# Patient Record
Sex: Female | Born: 1940 | Race: White | Hispanic: No | Smoking: Former smoker
Health system: Southern US, Community
[De-identification: ages and names within clinical notes are randomized; demographics above are authoritative.]

## PROBLEM LIST (undated history)

## (undated) DIAGNOSIS — F419 Anxiety disorder, unspecified: Secondary | ICD-10-CM

## (undated) DIAGNOSIS — N393 Stress incontinence (female) (male): Secondary | ICD-10-CM

## (undated) DIAGNOSIS — N898 Other specified noninflammatory disorders of vagina: Secondary | ICD-10-CM

## (undated) DIAGNOSIS — I10 Essential (primary) hypertension: Secondary | ICD-10-CM

## (undated) HISTORY — PX: ABDOMINAL HYSTERECTOMY: SHX81

---

## 1993-11-10 HISTORY — PX: REDUCTION MAMMAPLASTY: SUR839

## 1998-07-31 ENCOUNTER — Ambulatory Visit (HOSPITAL_COMMUNITY): Admission: RE | Admit: 1998-07-31 | Discharge: 1998-07-31 | Payer: Self-pay | Admitting: Obstetrics and Gynecology

## 2000-09-01 ENCOUNTER — Encounter: Payer: Self-pay | Admitting: Obstetrics and Gynecology

## 2000-09-01 ENCOUNTER — Encounter: Admission: RE | Admit: 2000-09-01 | Discharge: 2000-09-01 | Payer: Self-pay | Admitting: Obstetrics and Gynecology

## 2001-09-07 ENCOUNTER — Encounter: Admission: RE | Admit: 2001-09-07 | Discharge: 2001-09-07 | Payer: Self-pay | Admitting: Obstetrics and Gynecology

## 2001-09-07 ENCOUNTER — Encounter: Payer: Self-pay | Admitting: Obstetrics and Gynecology

## 2002-04-01 ENCOUNTER — Other Ambulatory Visit: Admission: RE | Admit: 2002-04-01 | Discharge: 2002-04-01 | Payer: Self-pay | Admitting: Obstetrics and Gynecology

## 2002-09-09 ENCOUNTER — Encounter: Admission: RE | Admit: 2002-09-09 | Discharge: 2002-09-09 | Payer: Self-pay | Admitting: Internal Medicine

## 2002-09-09 ENCOUNTER — Encounter: Payer: Self-pay | Admitting: Internal Medicine

## 2003-04-13 ENCOUNTER — Other Ambulatory Visit: Admission: RE | Admit: 2003-04-13 | Discharge: 2003-04-13 | Payer: Self-pay | Admitting: Obstetrics and Gynecology

## 2003-12-11 ENCOUNTER — Encounter (INDEPENDENT_AMBULATORY_CARE_PROVIDER_SITE_OTHER): Payer: Self-pay | Admitting: Specialist

## 2003-12-11 ENCOUNTER — Inpatient Hospital Stay (HOSPITAL_COMMUNITY): Admission: RE | Admit: 2003-12-11 | Discharge: 2003-12-13 | Payer: Self-pay | Admitting: Obstetrics and Gynecology

## 2003-12-11 HISTORY — PX: OTHER SURGICAL HISTORY: SHX169

## 2004-11-10 HISTORY — PX: OTHER SURGICAL HISTORY: SHX169

## 2005-05-14 ENCOUNTER — Other Ambulatory Visit: Admission: RE | Admit: 2005-05-14 | Discharge: 2005-05-14 | Payer: Self-pay | Admitting: Obstetrics and Gynecology

## 2006-04-29 ENCOUNTER — Encounter: Admission: RE | Admit: 2006-04-29 | Discharge: 2006-04-29 | Payer: Self-pay | Admitting: Internal Medicine

## 2008-09-06 ENCOUNTER — Encounter: Admission: RE | Admit: 2008-09-06 | Discharge: 2008-09-06 | Payer: Self-pay | Admitting: Family Medicine

## 2011-03-28 NOTE — Op Note (Signed)
NAME:  Kayla Logan, Kayla Logan                         ACCOUNT NO.:  [id number]   MEDICAL RECORD NO.:  [id number]                   PATIENT TYPE:  INP   LOCATION:  0440                                 FACILITY:  [facility]   PHYSICIAN:  Michelle L. Vincente Poli, M.D.            DATE OF BIRTH:  [birth year]   DATE OF PROCEDURE:  12/11/2003  DATE OF DISCHARGE:  12/13/2003                                 OPERATIVE REPORT   PREOPERATIVE DIAGNOSES:  1. Uterine prolapse.  2. Cystocele.  3. Rectocele.  4. Genuine stress urinary incontinence.   POSTOPERATIVE DIAGNOSES:  1. Uterine prolapse.  2. Cystocele.  3. Rectocele.  4. Genuine stress urinary incontinence.   PROCEDURES:  1. Total vaginal hysterectomy.  2. Anterior and posterior repair.   SURGEON:  Michelle L. Vincente Poli, M.D.   ASSISTANT:  Dineen Kid. Rana Snare, M.D.   ANESTHESIA:  General.   ESTIMATED BLOOD LOSS:  200 mL.   FINDINGS:  Grade 3 uterine prolapse, grade 3 cystocele, and grade 2  rectocele.   DESCRIPTION OF PROCEDURE:  The patient was taken to the operating room,  where she was intubated without difficulty and placed in the high lithotomy  position.  The vagina, vulva, and lower abdomen were prepped and draped in  the usual sterile fashion.  A Foley catheter was inserted to empty the  bladder.  A weighted speculum was placed in the vagina, the cervix was  grasped with a tenaculum, and a circumferential incision was made around the  cervix.  We then entered the posterior cul-de-sac in a standard fashion  using Mayo scissors and then with careful dissection anteriorly entered the  anterior cul-de-sac using sharp dissection using Metzenbaum scissors.  We  then used our curved Heaney clamps and clamped the uterosacral ligaments on  either side sequentially, each clamped and cut.  Each pedicle was cut and  suture ligated using Vicryl suture. We worked our way up along the broad  ligament with curved Heaney clamps.  Each pedicle was cut and  suture ligated  using 0 Vicryl suture.  Once we reached the level of the triple pedicle, we  then retroflexed the uterus and placed our curved Heaney clamps across the  triple pedicle on either side.  The pedicles were cut and the specimen was  set aside.  Each pedicle was then secured using a suture ligature of 0  Vicryl suture and a free tie of 0 Vicryl suture.  The ovaries appeared  normal and per the patient's request, we did not remove her ovaries because  they appeared normal.  At this point the posterior cuff was closed in a  continuous running locked stitch using 2-0 Vicryl suture and the posterior  two-thirds of the cuff was closed using 2-0 Vicryl suture in a continuous  running locked stitch. We then proceeded with our cystocele repair by  placing Allis clamps on either corner of the vaginal cuff anteriorly and  making a midline incision from the vaginal cuff to approximately 2 cm distal  to the UV angle.  We then reduced the cystocele using sharp and blunt  dissection, reduced the cystocele using three pursestring sutures using 0  Vicryl suture.  We then closed the posterior two-thirds of that using  interrupted 2-0 Vicryl suture.  At that point we left the anterior portion  of that open for Dr. Patsi Sears so that he could place the sling.  We then  proceeded with our posterior repair, placed Allis clamps at 5 and 7 o'clock,  made a V-shaped incision in the perineum, and made a midline incision up the  midline of the vaginal epithelium posteriorly.  We reduced the rectocele  using 2-0 Vicryl interrupted, moving distally to proximally.  After the  rectocele was reduced, we then trimmed the redundant tissue and closed the  vaginal epithelium using 2-0 Vicryl in a continuous running locked stitch.  At this part of the procedure Dr. Patsi Sears scrubbed in.  His portion of  the surgery will be dictated by him, and he performed a pubovaginal sling.  All sponge, lap, and instrument  counts were correct x2.  The patient  tolerated the procedure extremely well and went to the recovery room in  stable condition.                                               Michelle L. Vincente Poli, M.D.    Florestine Avers  D:  01/04/2004  T:  01/04/2004  Job:  [id number]

## 2011-03-28 NOTE — Op Note (Signed)
NAME:  Kayla Logan, Kayla Logan                         ACCOUNT NO.:  [id number]   MEDICAL RECORD NO.:  [id number]                   PATIENT TYPE:  INP   LOCATION:  NA                                   FACILITY:  [facility]   PHYSICIAN:  Sigmund I. Patsi Sears, M.D.         DATE OF BIRTH:  02-Jan-1941   DATE OF PROCEDURE:  12/11/2003  DATE OF DISCHARGE:                                 OPERATIVE REPORT   PREOPERATIVE DIAGNOSIS:  Urinary incontinence.   POSTOPERATIVE DIAGNOSIS:  Urinary incontinence.   OPERATION:  Pubovaginal sling.   SURGEON:  Sigmund I. Patsi Sears, M.D.   ANESTHESIA:  General endotracheal.   DESCRIPTION OF PROCEDURE:  With the patient in the dorsal lithotomy  position, she had previously undergone a hysterectomy, anterior and  posterior repair per Dr. Vincente Poli.   5 cc of Marcaine 0.25% plain is injected into the periurethral pubovaginal  cervical arch tissue, and a 1.5-cm incision is made over the urethra.  Subcutaneous tissue is dissected bilaterally.   Stab wounds are made 5 cm lateral to the vagina, at the level of the  clitoris.  The trans obturator tape is then passed in retrograde fashion  bilaterally.  A right-angled clamp is passed behind the sling to keep it  loose.  Cystoscopy reveals that there is no evidence of any bladder  disruption.  The bladder is drained of fluid, the tape is cut  subcutaneously, and the wound is closed with 3-0 Vicryl in two layers.  A  right-angled clamp is placed behind the sling to keep it loose while the  wings are cut.   The wound is cleaned, and packing is placed.  The patient is then given  Toradol, awakened and taken to the recovery room in good condition.                                               Sigmund I. Patsi Sears, M.D.    SIT/MEDQ  D:  12/11/2003  T:  12/11/2003  Job:  [id number]

## 2011-03-28 NOTE — Discharge Summary (Signed)
NAME:  MARRIAN, BELLS                         ACCOUNT NO.:  [id number]   MEDICAL RECORD NO.:  [id number]                   PATIENT TYPE:  INP   LOCATION:  0440                                 FACILITY:  [facility]   PHYSICIAN:  Michelle L. Vincente Poli, M.D.            DATE OF BIRTH:  December 12, 1940   DATE OF ADMISSION:  12/11/2003  DATE OF DISCHARGE:  12/13/2003                                 DISCHARGE SUMMARY   ADMISSION DIAGNOSIS:  Uterine prolapse, cystocele, rectocele, and stress  urinary incontinence.   DISCHARGE DIAGNOSIS:  Uterine prolapse, cystocele, rectocele, and stress  urinary incontinence.   HOSPITAL COURSE:  The patient is a 70 year old female who underwent a TBH,  A&P repair, and a pubovaginal sling.  She had an uneventful intraoperative  course. She did very well postoperatively and by postoperative day #2 was  ambulating, tolerating a regular diet, was voiding without any difficulty,  and had good pain control.  She had stable vital signs throughout her  hospitalization. Of note, her postoperative hemoglobin was 10.6  and  hematocrit was 31.4, white blood cell count was 9.2. This was performed on  postoperative day #1. She was discharged home with Tylox to take as needed  for pain. She is to follow up in the office in two weeks. She is advised no  driving for one week. She is advised to call if she has nausea, vomiting,  abdominal pain, or temperature greater than 100.5.                                               Michelle L. Vincente Poli, M.D.    Florestine Avers  D:  01/04/2004  T:  01/04/2004  Job:  [id number]

## 2011-11-11 HISTORY — PX: LIPOSUCTION HEAD / NECK: SUR831

## 2012-05-17 ENCOUNTER — Other Ambulatory Visit: Payer: Self-pay | Admitting: Urology

## 2012-05-18 ENCOUNTER — Encounter (HOSPITAL_BASED_OUTPATIENT_CLINIC_OR_DEPARTMENT_OTHER): Payer: Self-pay | Admitting: *Deleted

## 2012-05-18 NOTE — Progress Notes (Signed)
NPO AFTER MN. ARRIVES AT 1000. NEEDS ISTAT AND EKG. WILL TAKE PROPRANOLOL AM OF SURG W/ SIP OF WATER.

## 2012-05-21 ENCOUNTER — Encounter (HOSPITAL_BASED_OUTPATIENT_CLINIC_OR_DEPARTMENT_OTHER): Payer: Self-pay | Admitting: *Deleted

## 2012-05-21 ENCOUNTER — Encounter (HOSPITAL_BASED_OUTPATIENT_CLINIC_OR_DEPARTMENT_OTHER)
Admission: RE | Disposition: A | Discharge status: Home / Self Care | Payer: Self-pay | Source: Ambulatory Visit | Attending: Urology

## 2012-05-21 ENCOUNTER — Encounter (HOSPITAL_BASED_OUTPATIENT_CLINIC_OR_DEPARTMENT_OTHER): Payer: Self-pay | Admitting: Anesthesiology

## 2012-05-21 ENCOUNTER — Ambulatory Visit (HOSPITAL_BASED_OUTPATIENT_CLINIC_OR_DEPARTMENT_OTHER): Payer: Medicare Other | Admitting: Anesthesiology

## 2012-05-21 ENCOUNTER — Ambulatory Visit (HOSPITAL_BASED_OUTPATIENT_CLINIC_OR_DEPARTMENT_OTHER)
Admission: RE | Admit: 2012-05-21 | Discharge: 2012-05-21 | Disposition: A | Discharge status: Home / Self Care | Payer: Medicare Other | Source: Ambulatory Visit | Attending: Urology | Admitting: Urology

## 2012-05-21 DIAGNOSIS — N3010 Interstitial cystitis (chronic) without hematuria: Secondary | ICD-10-CM | POA: Insufficient documentation

## 2012-05-21 DIAGNOSIS — N3941 Urge incontinence: Secondary | ICD-10-CM | POA: Insufficient documentation

## 2012-05-21 DIAGNOSIS — I10 Essential (primary) hypertension: Secondary | ICD-10-CM | POA: Insufficient documentation

## 2012-05-21 HISTORY — DX: Essential (primary) hypertension: I10

## 2012-05-21 HISTORY — DX: Stress incontinence (female) (male): N39.3

## 2012-05-21 HISTORY — DX: Anxiety disorder, unspecified: F41.9

## 2012-05-21 HISTORY — PX: CYSTOSCOPY WITH INJECTION: SHX1424

## 2012-05-21 HISTORY — DX: Other specified noninflammatory disorders of vagina: N89.8

## 2012-05-21 LAB — POCT I-STAT 4, (NA,K, GLUC, HGB,HCT)
Hemoglobin: 13.6 g/dL (ref 12.0–15.0)
Sodium: 143 mEq/L (ref 135–145)

## 2012-05-21 SURGERY — CYSTOSCOPY, WITH INJECTION OF BLADDER NECK OR BLADDER WALL
Anesthesia: General | Site: Bladder | Wound class: Clean Contaminated

## 2012-05-21 MED ORDER — HYDROCODONE-ACETAMINOPHEN 5-325 MG PO TABS
1.0000 | ORAL_TABLET | Freq: Four times a day (QID) | ORAL | Status: DC | PRN
  Administered 2012-05-21: 1 via ORAL

## 2012-05-21 MED ORDER — ONABOTULINUMTOXINA 100 UNITS IJ SOLR
INTRAMUSCULAR | Status: DC | PRN
  Administered 2012-05-21: 200 [IU] via INTRAMUSCULAR

## 2012-05-21 MED ORDER — ONDANSETRON HCL 4 MG/2ML IJ SOLN
INTRAMUSCULAR | Status: DC | PRN
  Administered 2012-05-21: 4 mg via INTRAVENOUS

## 2012-05-21 MED ORDER — LIDOCAINE HCL (CARDIAC) 20 MG/ML IV SOLN
INTRAVENOUS | Status: DC | PRN
  Administered 2012-05-21: 80 mg via INTRAVENOUS

## 2012-05-21 MED ORDER — KETOROLAC TROMETHAMINE 30 MG/ML IJ SOLN
INTRAMUSCULAR | Status: DC | PRN
  Administered 2012-05-21: 30 mg via INTRAVENOUS

## 2012-05-21 MED ORDER — DEXAMETHASONE SODIUM PHOSPHATE 4 MG/ML IJ SOLN
INTRAMUSCULAR | Status: DC | PRN
  Administered 2012-05-21: 10 mg via INTRAVENOUS

## 2012-05-21 MED ORDER — LACTATED RINGERS IV SOLN
INTRAVENOUS | Status: DC | PRN
  Administered 2012-05-21 (×2): via INTRAVENOUS

## 2012-05-21 MED ORDER — LACTATED RINGERS IV SOLN
INTRAVENOUS | Status: DC
  Administered 2012-05-21: 11:00:00 via INTRAVENOUS

## 2012-05-21 MED ORDER — MIDAZOLAM HCL 5 MG/5ML IJ SOLN
INTRAMUSCULAR | Status: DC | PRN
  Administered 2012-05-21: 2 mg via INTRAVENOUS

## 2012-05-21 MED ORDER — URELLE 81 MG PO TABS: 1.0000 | ORAL_TABLET | Freq: Three times a day (TID) | ORAL | Status: DC

## 2012-05-21 MED ORDER — LACTATED RINGERS IV SOLN: INTRAVENOUS | Status: DC

## 2012-05-21 MED ORDER — FENTANYL CITRATE 0.05 MG/ML IJ SOLN: 25.0000 ug | INTRAMUSCULAR | Status: DC | PRN

## 2012-05-21 MED ORDER — FENTANYL CITRATE 0.05 MG/ML IJ SOLN
INTRAMUSCULAR | Status: DC | PRN
  Administered 2012-05-21: 100 ug via INTRAVENOUS

## 2012-05-21 MED ORDER — HYDROCODONE-ACETAMINOPHEN 7.5-650 MG PO TABS: 1.0000 | ORAL_TABLET | Freq: Four times a day (QID) | ORAL | Status: AC | PRN

## 2012-05-21 MED ORDER — PROPOFOL 10 MG/ML IV EMUL
INTRAVENOUS | Status: DC | PRN
  Administered 2012-05-21: 200 mg via INTRAVENOUS
  Administered 2012-05-21: 50 mg via INTRAVENOUS

## 2012-05-21 MED ORDER — STERILE WATER FOR IRRIGATION IR SOLN
Status: DC | PRN
  Administered 2012-05-21: 3000 mL

## 2012-05-21 MED ORDER — SUCCINYLCHOLINE CHLORIDE 20 MG/ML IJ SOLN
INTRAMUSCULAR | Status: DC | PRN
  Administered 2012-05-21: 20 mg via INTRAVENOUS

## 2012-05-21 SURGICAL SUPPLY — 19 items
BAG DRAIN URO-CYSTO SKYTR STRL (DRAIN) ×2 IMPLANT
BOOTIES KNEE HIGH SLOAN (MISCELLANEOUS) IMPLANT
CANISTER SUCT LVC 12 LTR MEDI- (MISCELLANEOUS) ×2 IMPLANT
CATH ROBINSON RED A/P 12FR (CATHETERS) IMPLANT
CLOTH BEACON ORANGE TIMEOUT ST (SAFETY) ×2 IMPLANT
DRAPE CAMERA CLOSED 9X96 (DRAPES) ×2 IMPLANT
ELECT REM PT RETURN 9FT ADLT (ELECTROSURGICAL)
ELECTRODE REM PT RTRN 9FT ADLT (ELECTROSURGICAL) IMPLANT
GLOVE BIO SURGEON STRL SZ7.5 (GLOVE) ×2 IMPLANT
GOWN PREVENTION PLUS LG XLONG (DISPOSABLE) ×2 IMPLANT
GOWN STRL REIN XL XLG (GOWN DISPOSABLE) ×2 IMPLANT
NDL SAFETY ECLIPSE 18X1.5 (NEEDLE) ×1 IMPLANT
NEEDLE HYPO 18GX1.5 SHARP (NEEDLE) ×1
NEEDLE RIGID UROPLASTY (NEEDLE) IMPLANT
PACK CYSTOSCOPY (CUSTOM PROCEDURE TRAY) ×2 IMPLANT
SYR 20CC LL (SYRINGE) ×2 IMPLANT
SYR BULB IRRIGATION 50ML (SYRINGE) IMPLANT
SYRINGE 10CC LL (SYRINGE) ×4 IMPLANT
WATER STERILE IRR 3000ML UROMA (IV SOLUTION) ×2 IMPLANT

## 2012-05-21 NOTE — Anesthesia Procedure Notes (Signed)
Procedure Name: LMA Insertion Date/Time: 05/21/2012 1:16 PM Performed by: Jessica Priest Pre-anesthesia Checklist: Patient identified, Emergency Drugs available, Suction available and Patient being monitored Patient Re-evaluated:Patient Re-evaluated prior to inductionOxygen Delivery Method: Circle System Utilized Preoxygenation: Pre-oxygenation with 100% oxygen Intubation Type: IV induction Ventilation: Mask ventilation without difficulty LMA: LMA inserted LMA Size: 4.0 Number of attempts: 1 Airway Equipment and Method: bite block Placement Confirmation: positive ETCO2 Tube secured with: Tape Dental Injury: Teeth and Oropharynx as per pre-operative assessment

## 2012-05-21 NOTE — Anesthesia Preprocedure Evaluation (Addendum)
Anesthesia Evaluation  Patient identified by MRN, date of birth, ID band Patient awake    Reviewed: Allergy & Precautions, H&P , NPO status , Patient's Chart, lab work & pertinent test results, reviewed documented beta blocker date and time   History of Anesthesia Complications (+) DIFFICULT AIRWAY  Airway Mallampati: II TM Distance: >3 FB Neck ROM: full    Dental  (+) Dental Advisory Given and Caps,    Pulmonary neg pulmonary ROS,  breath sounds clear to auscultation  Pulmonary exam normal       Cardiovascular Exercise Tolerance: Good hypertension, Pt. on medications and Pt. on home beta blockers negative cardio ROS  Rhythm:regular Rate:Normal     Neuro/Psych negative neurological ROS  negative psych ROS   GI/Hepatic negative GI ROS, Neg liver ROS,   Endo/Other  negative endocrine ROS  Renal/GU negative Renal ROS  negative genitourinary   Musculoskeletal   Abdominal   Peds  Hematology negative hematology ROS (+)   Anesthesia Other Findings   Reproductive/Obstetrics negative OB ROS                          Anesthesia Physical Anesthesia Plan  ASA: II  Anesthesia Plan: General   Post-op Pain Management:    Induction: Intravenous  Airway Management Planned: LMA  Additional Equipment:   Intra-op Plan:   Post-operative Plan:   Informed Consent: I have reviewed the patients History and Physical, chart, labs and discussed the procedure including the risks, benefits and alternatives for the proposed anesthesia with the patient or authorized representative who has indicated his/her understanding and acceptance.   Dental Advisory Given  Plan Discussed with: CRNA and Surgeon  Anesthesia Plan Comments:         Anesthesia Quick Evaluation

## 2012-05-21 NOTE — Transfer of Care (Signed)
Immediate Anesthesia Transfer of Care Note  Patient: Kayla Logan  Procedure(s) Performed: Procedure(s) (LRB): CYSTOSCOPY WITH INJECTION (N/A)  Patient Location: PACU  Anesthesia Type: General  Level of Consciousness: awake, sedated, patient cooperative and responds to stimulation  Airway & Oxygen Therapy: Patient Spontanous Breathing and Patient connected to face mask oxygen  Post-op Assessment: Report given to PACU RN, Post -op Vital signs reviewed and stable and Patient moving all extremities  Post vital signs: Reviewed and stable  Complications: No apparent anesthesia complications
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
## 2012-05-21 NOTE — Interval H&P Note (Signed)
History and Physical Interval Note:  05/21/2012 12:25 PM  Kayla Logan  has presented today for surgery, with the diagnosis of Overactive Bladder  The various methods of treatment have been discussed with the patient and family. After consideration of risks, benefits and other options for treatment, the patient has consented to  Procedure(s) (LRB): CYSTOSCOPY WITH INJECTION (N/A) as a surgical intervention .  The patient's history has been reviewed, patient examined, no change in status, stable for surgery.  I have reviewed the patients' chart and labs.  Questions were answered to the patient's satisfaction.     Jethro Bolus I

## 2012-05-24 NOTE — Anesthesia Postprocedure Evaluation (Signed)
Anesthesia Post Note  Patient: Kayla Logan  Procedure(s) Performed: Procedure(s) (LRB): CYSTOSCOPY WITH INJECTION (N/A)  Anesthesia type: General  Patient location: PACU  Post pain: Pain level controlled  Post assessment: Post-op Vital signs reviewed  Last Vitals:  Filed Vitals:   05/21/12 1500  BP:   Pulse: 60  Temp: 36.1 C  Resp:     Post vital signs: Reviewed  Level of consciousness: sedated  Complications: No apparent anesthesia complications

## 2012-05-25 ENCOUNTER — Encounter (HOSPITAL_BASED_OUTPATIENT_CLINIC_OR_DEPARTMENT_OTHER): Payer: Self-pay | Admitting: Urology

## 2012-05-27 ENCOUNTER — Encounter (HOSPITAL_BASED_OUTPATIENT_CLINIC_OR_DEPARTMENT_OTHER): Payer: Self-pay

## 2012-08-25 ENCOUNTER — Other Ambulatory Visit (HOSPITAL_COMMUNITY)
Admission: RE | Admit: 2012-08-25 | Discharge: 2012-08-25 | Disposition: A | Discharge status: Home / Self Care | Payer: Medicare Other | Source: Ambulatory Visit | Attending: Obstetrics and Gynecology | Admitting: Obstetrics and Gynecology

## 2012-08-25 ENCOUNTER — Other Ambulatory Visit: Payer: Self-pay | Admitting: Obstetrics and Gynecology

## 2012-08-25 DIAGNOSIS — Z1151 Encounter for screening for human papillomavirus (HPV): Secondary | ICD-10-CM | POA: Insufficient documentation

## 2012-08-25 DIAGNOSIS — Z124 Encounter for screening for malignant neoplasm of cervix: Secondary | ICD-10-CM | POA: Insufficient documentation

## 2012-09-06 ENCOUNTER — Other Ambulatory Visit: Payer: Self-pay | Admitting: Obstetrics and Gynecology

## 2012-09-06 DIAGNOSIS — Z1231 Encounter for screening mammogram for malignant neoplasm of breast: Secondary | ICD-10-CM

## 2012-10-13 ENCOUNTER — Ambulatory Visit
Admission: RE | Admit: 2012-10-13 | Discharge: 2012-10-13 | Disposition: A | Discharge status: Home / Self Care | Payer: Medicare Other | Source: Ambulatory Visit | Attending: Obstetrics and Gynecology | Admitting: Obstetrics and Gynecology

## 2012-10-13 DIAGNOSIS — Z1231 Encounter for screening mammogram for malignant neoplasm of breast: Secondary | ICD-10-CM

## 2014-06-14 ENCOUNTER — Other Ambulatory Visit: Payer: Self-pay | Admitting: Plastic Surgery

## 2014-09-05 ENCOUNTER — Other Ambulatory Visit: Payer: Self-pay

## 2014-09-05 DIAGNOSIS — Z1231 Encounter for screening mammogram for malignant neoplasm of breast: Secondary | ICD-10-CM

## 2014-09-27 ENCOUNTER — Ambulatory Visit
Admission: RE | Admit: 2014-09-27 | Discharge: 2014-09-27 | Disposition: A | Discharge status: Home / Self Care | Payer: Medicare Other | Source: Ambulatory Visit

## 2014-09-27 DIAGNOSIS — Z1231 Encounter for screening mammogram for malignant neoplasm of breast: Secondary | ICD-10-CM

## 2016-09-23 ENCOUNTER — Other Ambulatory Visit: Payer: Self-pay | Admitting: Obstetrics and Gynecology

## 2016-09-23 DIAGNOSIS — Z1231 Encounter for screening mammogram for malignant neoplasm of breast: Secondary | ICD-10-CM

## 2016-10-29 ENCOUNTER — Ambulatory Visit
Admission: RE | Admit: 2016-10-29 | Discharge: 2016-10-29 | Disposition: A | Discharge status: Home / Self Care | Payer: Medicare Other | Source: Ambulatory Visit | Attending: Obstetrics and Gynecology | Admitting: Obstetrics and Gynecology

## 2016-10-29 DIAGNOSIS — Z1231 Encounter for screening mammogram for malignant neoplasm of breast: Secondary | ICD-10-CM

## 2016-11-27 ENCOUNTER — Emergency Department (HOSPITAL_COMMUNITY)
Admission: EM | Admit: 2016-11-27 | Discharge: 2016-11-28 | Disposition: A | Discharge status: Home / Self Care | Payer: Medicare Other | Attending: Emergency Medicine | Admitting: Emergency Medicine

## 2016-11-27 ENCOUNTER — Emergency Department (HOSPITAL_COMMUNITY): Payer: Medicare Other

## 2016-11-27 ENCOUNTER — Encounter (HOSPITAL_COMMUNITY): Payer: Self-pay | Admitting: Emergency Medicine

## 2016-11-27 DIAGNOSIS — Z91040 Latex allergy status: Secondary | ICD-10-CM | POA: Diagnosis not present

## 2016-11-27 DIAGNOSIS — S42202A Unspecified fracture of upper end of left humerus, initial encounter for closed fracture: Secondary | ICD-10-CM | POA: Diagnosis not present

## 2016-11-27 DIAGNOSIS — Z79899 Other long term (current) drug therapy: Secondary | ICD-10-CM | POA: Diagnosis not present

## 2016-11-27 DIAGNOSIS — Z87891 Personal history of nicotine dependence: Secondary | ICD-10-CM | POA: Insufficient documentation

## 2016-11-27 DIAGNOSIS — Y999 Unspecified external cause status: Secondary | ICD-10-CM | POA: Diagnosis not present

## 2016-11-27 DIAGNOSIS — W000XXA Fall on same level due to ice and snow, initial encounter: Secondary | ICD-10-CM | POA: Insufficient documentation

## 2016-11-27 DIAGNOSIS — Y92096 Garden or yard of other non-institutional residence as the place of occurrence of the external cause: Secondary | ICD-10-CM | POA: Insufficient documentation

## 2016-11-27 DIAGNOSIS — S42292A Other displaced fracture of upper end of left humerus, initial encounter for closed fracture: Secondary | ICD-10-CM

## 2016-11-27 DIAGNOSIS — I10 Essential (primary) hypertension: Secondary | ICD-10-CM | POA: Insufficient documentation

## 2016-11-27 DIAGNOSIS — Y939 Activity, unspecified: Secondary | ICD-10-CM | POA: Insufficient documentation

## 2016-11-27 DIAGNOSIS — W009XXA Unspecified fall due to ice and snow, initial encounter: Secondary | ICD-10-CM

## 2016-11-27 DIAGNOSIS — S4992XA Unspecified injury of left shoulder and upper arm, initial encounter: Secondary | ICD-10-CM | POA: Diagnosis present

## 2016-11-27 MED ORDER — FENTANYL CITRATE (PF) 100 MCG/2ML IJ SOLN
50.0000 ug | Freq: Once | INTRAMUSCULAR | Status: AC
  Administered 2016-11-27: 50 ug via INTRAVENOUS
  Filled 2016-11-27: qty 2

## 2016-11-27 MED ORDER — KETOROLAC TROMETHAMINE 30 MG/ML IJ SOLN: 30.0000 mg | Freq: Once | INTRAMUSCULAR | Status: DC

## 2016-11-27 MED ORDER — OXYCODONE-ACETAMINOPHEN 5-325 MG PO TABS
2.0000 | ORAL_TABLET | Freq: Once | ORAL | Status: AC
  Administered 2016-11-27: 2 via ORAL
  Filled 2016-11-27: qty 2

## 2016-11-27 MED ORDER — OXYCODONE-ACETAMINOPHEN 5-325 MG PO TABS: 1.0000 | ORAL_TABLET | Freq: Four times a day (QID) | ORAL | 0 refills | Status: DC | PRN

## 2016-11-27 MED ORDER — KETOROLAC TROMETHAMINE 30 MG/ML IJ SOLN
15.0000 mg | Freq: Once | INTRAMUSCULAR | Status: AC
  Administered 2016-11-27: 15 mg via INTRAVENOUS
  Filled 2016-11-27: qty 1

## 2016-11-27 NOTE — ED Triage Notes (Signed)
Per EMS, pt slipped and fell in her yard on the ice and landed on her left shoulder and arm.  Pt c/o of left arm pain that radiates below the elbow and also pain in joints from the fall.   Pt is alert and oriented and speaking in full sentences.  Pt received 50 mcg of fentanyl with EMS.

## 2016-11-27 NOTE — ED Provider Notes (Signed)
[facility]-EMERGENCY DEPT Provider Note   CSN: [id number] Arrival date & time: 11/27/16  1954 By signing my name below, I, Levon Hedger, attest that this documentation has been prepared under the direction and in the presence of non-physician practitioner, Antony Madura, PA-C. Electronically Signed: Levon Hedger, Scribe. 11/27/2016. 9:00 PM.    History   Chief Complaint Chief Complaint  Patient presents with  . Fall   HPI Kayla Logan is a 76 y.o. female with a history of HTN and stress urinary incontinence who presents to the Emergency Department complaining of sudden onset, constant left shoulder pain that radiates to her left forearm onset tonight s/p mechanical fall tonight PTA. Pt states she slipped on the ice in her yard and fell, landing on her left shoulder and arm. She states she felt her upper arm "twisting" as she fell. Pt was given 50 mcg fentanyl by EMS with no reported relief of pain.  Pt describes her pain as 10/10 pain that is exacerbated by movement. She has a hx of urinary incontinence and is s/p bladder sling; reports her "pants were wet". No back pain or extremity numbness/weakness. Pt denies any LOC, head injury, bowel incontinence, or any other associated symptoms.   The history is provided by the patient. No language interpreter was used.   Past Medical History:  Diagnosis Date  . Anxiety   . Depression   . Hypertension   . SUI (stress urinary incontinence, female)   . Vaginal itching     There are no active problems to display for this patient.  Past Surgical History:  Procedure Laterality Date  . ABDOMINALPLASTY  2006  . CYSTOSCOPY WITH INJECTION  05/21/2012   Procedure: CYSTOSCOPY WITH INJECTION;  Surgeon: Kathi Ludwig, MD;  Location: [facility];  Service: Urology;  Laterality: N/A;  . LIPOSUCTION HEAD / NECK  JAN 2013  . VAGINAL HYSTERECTOMY/ ANTERIOR AND POSTERIOR REPAIR/ PUBOVAGINAL SLING  12-11-2003    OB History    No data  available     Home Medications    Prior to Admission medications   Medication Sig Start Date End Date Taking? Authorizing Provider  acetaminophen (TYLENOL) 500 MG tablet Take 1,000 mg by mouth every morning.    Yes Historical Provider, MD  Calcium Carbonate-Vitamin D (CALCIUM + D PO) Take 1 tablet by mouth daily.    Yes Historical Provider, MD  citalopram (CELEXA) 10 MG tablet Take 10 mg by mouth daily.   Yes Historical Provider, MD  docusate sodium (COLACE) 100 MG capsule Take 300 mg by mouth daily.    Yes Historical Provider, MD  estradiol (ESTRACE) 0.1 MG/GM vaginal cream Place 2 g vaginally 2 (two) times a week. Mondays and fridays   Yes Historical Provider, MD  GLUCOSAMINE-CHONDROITIN PO Take 2 tablets by mouth daily.    Yes Historical Provider, MD  losartan-hydrochlorothiazide (HYZAAR) 100-25 MG per tablet Take 1 tablet by mouth daily.   Yes Historical Provider, MD  MAGNESIUM PO Take 1 tablet by mouth daily.    Yes Historical Provider, MD  methylPREDNISolone (MEDROL) 4 MG tablet Take 2 mg by mouth daily.  11/21/16  Yes Historical Provider, MD  propranolol (INDERAL) 20 MG tablet Take 20 mg by mouth 3 (three) times daily.   Yes Historical Provider, MD  Red Yeast Rice Extract (RED YEAST RICE PO) Take 2 tablets by mouth daily.    Yes Historical Provider, MD  traMADol (ULTRAM) 50 MG tablet Take 50 mg by mouth daily.  Yes Historical Provider, MD  oxyCODONE-acetaminophen (PERCOCET/ROXICET) 5-325 MG tablet Take 1-2 tablets by mouth every 6 (six) hours as needed for severe pain. 11/27/16   Antony MaduraKelly Colisha Redler, PA-C  URELLE (URELLE/URISED) 81 MG TABS Take 1 tablet (81 mg total) by mouth 3 (three) times daily. Patient not taking: Reported on 11/27/2016 05/21/12   Jethro BolusSigmund Tannenbaum, MD   Family History History reviewed. No pertinent family history.  Social History Social History  Substance Use Topics  . Smoking status: Former Smoker    Packs/day: 0.10    Years: 15.00    Types: Cigarettes    Quit  date: 05/19/1983  . Smokeless tobacco: Never Used  . Alcohol use No    Allergies   Epinephrine; Demerol [meperidine]; and Latex  Review of Systems Review of Systems 10 systems reviewed and all are negative for acute change except as noted in the HPI.   Physical Exam Updated Vital Signs BP 142/89 (BP Location: Right Arm)   Pulse 60   Temp 99 F (37.2 C) (Oral)   Resp 20   Ht 5\' 5"  (1.651 m)   Wt 86.2 kg   SpO2 97%   BMI 31.62 kg/m   Physical Exam  Constitutional: She is oriented to person, place, and time. She appears well-developed and well-nourished. No distress.  HENT:  Head: Normocephalic and atraumatic.  Eyes: Conjunctivae and EOM are normal. No scleral icterus.  Neck: Normal range of motion.  Cardiovascular: Normal rate, regular rhythm and intact distal pulses.   Distal radial pulse 2+ in the LUE. Capillary refill brisk in all digits of the left hand.  Pulmonary/Chest: Effort normal. No respiratory distress.  Respirations even and unlabored  Musculoskeletal:  Limited ROM at the left shoulder. TTP diffusely to the LUE; worse at the left wrist and L shoulder. No crepitus or deformity. Normal ROM of wrist. No elbow effusion or crepitus. Patient reports normal ROM of the left elbow since the fall.  Neurological: She is alert and oriented to person, place, and time. She exhibits normal muscle tone. Coordination normal.  Sensation to light touch intact in the LUE. Grip strength 5/5 in the L hand.  Skin: Skin is warm and dry. No rash noted. She is not diaphoretic. No erythema. No pallor.  Psychiatric: She has a normal mood and affect. Her behavior is normal.  Nursing note and vitals reviewed.   ED Treatments / Results  DIAGNOSTIC STUDIES:  Oxygen Saturation is 98% on RA, normal by my interpretation.    COORDINATION OF CARE:  8:58 PM Discussed treatment plan with pt at bedside and pt agreed to plan.   Labs (all labs ordered are listed, but only abnormal results are  displayed) Labs Reviewed - No data to display  EKG  EKG Interpretation None      Radiology Dg Forearm Left  Result Date: 11/27/2016 CLINICAL DATA:  Pain to the left forearm after a fall EXAM: LEFT FOREARM - 2 VIEW COMPARISON:  None. FINDINGS: No acute fracture or dislocation is evident. Soft tissues are unremarkable. IMPRESSION: No acute osseous abnormality. Electronically Signed   By: Jasmine PangKim  Fujinaga M.D.   On: 11/27/2016 21:42   Dg Humerus Left  Result Date: 11/27/2016 CLINICAL DATA:  Fall with pain EXAM: LEFT HUMERUS - 2+ VIEW COMPARISON:  None. FINDINGS: No dislocation is evident. Lateral image markedly degraded by overlying soft tissues. There is a fracture of the proximal left humerus at the junction of the head and neck. Approximately 1/4 shaft diameter of displacement toward the  midline of distal fracture fragment. There is mild valgus angulation. IMPRESSION: Mildly displaced and angulated fracture of the proximal left humerus at the junction of the head and neck. No dislocation. Electronically Signed   By: Jasmine Pang M.D.   On: 11/27/2016 21:44    Procedures Procedures (including critical care time)  Medications Ordered in ED Medications  fentaNYL (SUBLIMAZE) injection 50 mcg (50 mcg Intravenous Given 11/27/16 2047)  oxyCODONE-acetaminophen (PERCOCET/ROXICET) 5-325 MG per tablet 2 tablet (2 tablets Oral Given 11/27/16 2331)  ketorolac (TORADOL) 30 MG/ML injection 15 mg (15 mg Intravenous Given 11/27/16 2333)     Initial Impression / Assessment and Plan / ED Course  I have reviewed the triage vital signs and the nursing notes.  Pertinent labs & imaging results that were available during my care of the patient were reviewed by me and considered in my medical decision making (see chart for details).     76 year old female presents to the emergency department for evaluation of injuries following a fall. She reports slipping on ice and landing on her left upper extremity. No  head trauma or loss of consciousness. No complaints of back pain, extremity numbness, or extremity weakness. Patient is neurovascularly intact. X-ray reveals a mildly displaced and angulated fracture of the proximal left humerus. No dislocation. Patient immobilized in the emergency department. She has been given small dose of Toradol as well as IV fentanyl and oral Percocet for pain control. Will refer to orthopedics to ensure proper healing. Return precautions discussed and provided. Patient discharged in stable condition with no unaddressed concerns.   Final Clinical Impressions(s) / ED Diagnoses   Final diagnoses:  Humeral head fracture, left, closed, initial encounter  Fall due to slipping on ice or snow, initial encounter    New Prescriptions Discharge Medication List as of 11/27/2016 11:12 PM    START taking these medications   Details  oxyCODONE-acetaminophen (PERCOCET/ROXICET) 5-325 MG tablet Take 1-2 tablets by mouth every 6 (six) hours as needed for severe pain., Starting Thu 11/27/2016, Print       I personally performed the services described in this documentation, which was scribed in my presence. The recorded information has been reviewed and is accurate.      Antony Madura, PA-C 11/28/16 0021    Shaune Pollack, MD 11/28/16 [phone]

## 2016-11-27 NOTE — ED Notes (Signed)
Bed: WA21 Expected date:  Expected time:  Means of arrival:  Comments: 76 yr old fall, shoulder and arm pain

## 2016-11-27 NOTE — Discharge Instructions (Signed)
Wear a sling at all times for immobilization. You can remove this for showering, if needed. Take Percocet as prescribed for pain control. Do not take Percocet with Tylenol/acetaminophen as there is already Tylenol in this medication. We also advise that you discontinue use of tramadol while taking Percocet as taking these medications together will make you increasingly drowsy/sleepy. Apply ice to areas of pain/injury to limit swelling. Follow-up with orthopedics to ensure proper wound healing. You may return to the emergency department, as needed, for new or concerning symptoms.

## 2017-09-24 ENCOUNTER — Other Ambulatory Visit: Payer: Self-pay | Admitting: Obstetrics and Gynecology

## 2017-09-24 DIAGNOSIS — Z1231 Encounter for screening mammogram for malignant neoplasm of breast: Secondary | ICD-10-CM

## 2017-11-12 ENCOUNTER — Ambulatory Visit
Admission: RE | Admit: 2017-11-12 | Discharge: 2017-11-12 | Disposition: A | Discharge status: Home / Self Care | Payer: Medicare Other | Source: Ambulatory Visit | Attending: Obstetrics and Gynecology | Admitting: Obstetrics and Gynecology

## 2017-11-12 DIAGNOSIS — Z1231 Encounter for screening mammogram for malignant neoplasm of breast: Secondary | ICD-10-CM

## 2018-08-11 ENCOUNTER — Other Ambulatory Visit: Payer: Self-pay

## 2018-08-11 ENCOUNTER — Encounter (HOSPITAL_BASED_OUTPATIENT_CLINIC_OR_DEPARTMENT_OTHER): Payer: Self-pay | Admitting: *Deleted

## 2018-08-13 ENCOUNTER — Encounter (HOSPITAL_BASED_OUTPATIENT_CLINIC_OR_DEPARTMENT_OTHER)
Admission: RE | Admit: 2018-08-13 | Discharge: 2018-08-13 | Disposition: A | Discharge status: Home / Self Care | Payer: Medicare Other | Source: Ambulatory Visit | Attending: Orthopaedic Surgery | Admitting: Orthopaedic Surgery

## 2018-08-13 DIAGNOSIS — R9431 Abnormal electrocardiogram [ECG] [EKG]: Secondary | ICD-10-CM | POA: Insufficient documentation

## 2018-08-13 DIAGNOSIS — M2041 Other hammer toe(s) (acquired), right foot: Secondary | ICD-10-CM | POA: Insufficient documentation

## 2018-08-13 DIAGNOSIS — Z01818 Encounter for other preprocedural examination: Secondary | ICD-10-CM | POA: Insufficient documentation

## 2018-08-13 LAB — BASIC METABOLIC PANEL
ANION GAP: 9 (ref 5–15)
BUN: 26 mg/dL — ABNORMAL HIGH (ref 8–23)
CHLORIDE: 106 mmol/L (ref 98–111)
CO2: 26 mmol/L (ref 22–32)
Calcium: 9.3 mg/dL (ref 8.9–10.3)
Creatinine, Ser: 1.24 mg/dL — ABNORMAL HIGH (ref 0.44–1.00)
GFR, EST AFRICAN AMERICAN: 48 mL/min — AB (ref >60)
GFR, EST NON AFRICAN AMERICAN: 41 mL/min — AB (ref >60)
Glucose, Bld: 97 mg/dL (ref 70–99)
Potassium: 3.6 mmol/L (ref 3.5–5.1)
Sodium: 141 mmol/L (ref 135–145)

## 2018-08-16 ENCOUNTER — Other Ambulatory Visit: Payer: Self-pay | Admitting: Orthopaedic Surgery

## 2018-08-18 ENCOUNTER — Ambulatory Visit (HOSPITAL_BASED_OUTPATIENT_CLINIC_OR_DEPARTMENT_OTHER): Payer: Medicare Other | Admitting: Anesthesiology

## 2018-08-18 ENCOUNTER — Other Ambulatory Visit: Payer: Self-pay

## 2018-08-18 ENCOUNTER — Encounter (HOSPITAL_BASED_OUTPATIENT_CLINIC_OR_DEPARTMENT_OTHER): Payer: Self-pay | Admitting: *Deleted

## 2018-08-18 ENCOUNTER — Encounter (HOSPITAL_BASED_OUTPATIENT_CLINIC_OR_DEPARTMENT_OTHER)
Admission: RE | Disposition: A | Discharge status: Home / Self Care | Payer: Self-pay | Source: Ambulatory Visit | Attending: Orthopaedic Surgery

## 2018-08-18 ENCOUNTER — Ambulatory Visit (HOSPITAL_BASED_OUTPATIENT_CLINIC_OR_DEPARTMENT_OTHER)
Admission: RE | Admit: 2018-08-18 | Discharge: 2018-08-18 | Disposition: A | Discharge status: Home / Self Care | Payer: Medicare Other | Source: Ambulatory Visit | Attending: Orthopaedic Surgery | Admitting: Orthopaedic Surgery

## 2018-08-18 DIAGNOSIS — F419 Anxiety disorder, unspecified: Secondary | ICD-10-CM | POA: Insufficient documentation

## 2018-08-18 DIAGNOSIS — M2041 Other hammer toe(s) (acquired), right foot: Secondary | ICD-10-CM | POA: Diagnosis present

## 2018-08-18 DIAGNOSIS — Z79899 Other long term (current) drug therapy: Secondary | ICD-10-CM | POA: Insufficient documentation

## 2018-08-18 DIAGNOSIS — Y929 Unspecified place or not applicable: Secondary | ICD-10-CM | POA: Diagnosis not present

## 2018-08-18 DIAGNOSIS — M7741 Metatarsalgia, right foot: Secondary | ICD-10-CM | POA: Insufficient documentation

## 2018-08-18 DIAGNOSIS — I10 Essential (primary) hypertension: Secondary | ICD-10-CM | POA: Insufficient documentation

## 2018-08-18 DIAGNOSIS — X58XXXA Exposure to other specified factors, initial encounter: Secondary | ICD-10-CM | POA: Insufficient documentation

## 2018-08-18 DIAGNOSIS — Z885 Allergy status to narcotic agent status: Secondary | ICD-10-CM | POA: Diagnosis not present

## 2018-08-18 DIAGNOSIS — Z87891 Personal history of nicotine dependence: Secondary | ICD-10-CM | POA: Insufficient documentation

## 2018-08-18 DIAGNOSIS — S93144A Subluxation of metatarsophalangeal joint of right lesser toe(s), initial encounter: Secondary | ICD-10-CM | POA: Insufficient documentation

## 2018-08-18 HISTORY — PX: HAMMERTOE RECONSTRUCTION WITH WEIL OSTEOTOMY: SHX5631

## 2018-08-18 SURGERY — HAMMERTOE RECONSTRUCTION WITH WEIL OSTEOTOMY
Anesthesia: Monitor Anesthesia Care | Site: Foot | Laterality: Right

## 2018-08-18 MED ORDER — BUPIVACAINE HCL 0.5 % IJ SOLN
INTRAMUSCULAR | Status: DC | PRN
  Administered 2018-08-18: 20 mL

## 2018-08-18 MED ORDER — MIDAZOLAM HCL 2 MG/2ML IJ SOLN
INTRAMUSCULAR | Status: AC
  Filled 2018-08-18: qty 2

## 2018-08-18 MED ORDER — FENTANYL CITRATE (PF) 100 MCG/2ML IJ SOLN
50.0000 ug | INTRAMUSCULAR | Status: AC | PRN
  Administered 2018-08-18 (×2): 25 ug via INTRAVENOUS
  Administered 2018-08-18: 50 ug via INTRAVENOUS

## 2018-08-18 MED ORDER — FENTANYL CITRATE (PF) 100 MCG/2ML IJ SOLN
25.0000 ug | INTRAMUSCULAR | Status: DC | PRN
  Administered 2018-08-18: 50 ug via INTRAVENOUS

## 2018-08-18 MED ORDER — PROPOFOL 500 MG/50ML IV EMUL
INTRAVENOUS | Status: DC | PRN
  Administered 2018-08-18: 75 ug/kg/min via INTRAVENOUS

## 2018-08-18 MED ORDER — FENTANYL CITRATE (PF) 100 MCG/2ML IJ SOLN
INTRAMUSCULAR | Status: AC
  Filled 2018-08-18: qty 2

## 2018-08-18 MED ORDER — SCOPOLAMINE 1 MG/3DAYS TD PT72: 1.0000 | MEDICATED_PATCH | Freq: Once | TRANSDERMAL | Status: DC | PRN

## 2018-08-18 MED ORDER — CEFAZOLIN SODIUM-DEXTROSE 2-4 GM/100ML-% IV SOLN
INTRAVENOUS | Status: AC
  Filled 2018-08-18: qty 100

## 2018-08-18 MED ORDER — OXYCODONE HCL 5 MG PO TABS
ORAL_TABLET | ORAL | Status: AC
  Filled 2018-08-18: qty 1

## 2018-08-18 MED ORDER — PROPOFOL 10 MG/ML IV BOLUS
INTRAVENOUS | Status: DC | PRN
  Administered 2018-08-18: 20 mg via INTRAVENOUS

## 2018-08-18 MED ORDER — MIDAZOLAM HCL 2 MG/2ML IJ SOLN: 1.0000 mg | INTRAMUSCULAR | Status: DC | PRN

## 2018-08-18 MED ORDER — ONDANSETRON HCL 4 MG/2ML IJ SOLN
INTRAMUSCULAR | Status: DC | PRN
  Administered 2018-08-18: 4 mg via INTRAVENOUS

## 2018-08-18 MED ORDER — OXYCODONE HCL 5 MG PO TABS
5.0000 mg | ORAL_TABLET | Freq: Once | ORAL | Status: AC | PRN
  Administered 2018-08-18: 5 mg via ORAL

## 2018-08-18 MED ORDER — ONDANSETRON HCL 4 MG/2ML IJ SOLN: 4.0000 mg | Freq: Once | INTRAMUSCULAR | Status: DC | PRN

## 2018-08-18 MED ORDER — CEFAZOLIN SODIUM-DEXTROSE 2-4 GM/100ML-% IV SOLN
2.0000 g | INTRAVENOUS | Status: AC
  Administered 2018-08-18: 2 g via INTRAVENOUS

## 2018-08-18 MED ORDER — LIDOCAINE HCL (CARDIAC) PF 100 MG/5ML IV SOSY
PREFILLED_SYRINGE | INTRAVENOUS | Status: DC | PRN
  Administered 2018-08-18: 60 mg via INTRAVENOUS

## 2018-08-18 MED ORDER — LACTATED RINGERS IV SOLN
INTRAVENOUS | Status: DC
  Administered 2018-08-18 (×2): via INTRAVENOUS

## 2018-08-18 SURGICAL SUPPLY — 71 items
BANDAGE ACE 4X5 VEL STRL LF (GAUZE/BANDAGES/DRESSINGS) ×3 IMPLANT
BANDAGE ACE 6X5 VEL STRL LF (GAUZE/BANDAGES/DRESSINGS) ×3 IMPLANT
BANDAGE ESMARK 6X9 LF (GAUZE/BANDAGES/DRESSINGS) IMPLANT
BENZOIN TINCTURE PRP APPL 2/3 (GAUZE/BANDAGES/DRESSINGS) IMPLANT
BIT DRILL PROS QC 1.5 (BIT) ×2 IMPLANT
BIT DRILL PROS QC 1.5MM (BIT) ×1
BLADE OSC/SAG .038X5.5 CUT EDG (BLADE) ×3 IMPLANT
BLADE SURG 15 STRL LF DISP TIS (BLADE) ×7 IMPLANT
BLADE SURG 15 STRL SS (BLADE) ×14
BNDG COHESIVE 4X5 TAN STRL (GAUZE/BANDAGES/DRESSINGS) IMPLANT
BNDG ESMARK 4X9 LF (GAUZE/BANDAGES/DRESSINGS) ×3 IMPLANT
BNDG ESMARK 6X9 LF (GAUZE/BANDAGES/DRESSINGS)
CAP PIN PROTECTOR ORTHO WHT (CAP) ×3 IMPLANT
CHLORAPREP W/TINT 26ML (MISCELLANEOUS) ×3 IMPLANT
CLOSURE WOUND 1/2 X4 (GAUZE/BANDAGES/DRESSINGS)
COVER BACK TABLE 60X90IN (DRAPES) ×3 IMPLANT
COVER WAND RF STERILE (DRAPES) IMPLANT
CUFF TOURNIQUET SINGLE 34IN LL (TOURNIQUET CUFF) IMPLANT
DECANTER SPIKE VIAL GLASS SM (MISCELLANEOUS) ×3 IMPLANT
DRAPE EXTREMITY T 121X128X90 (DRAPE) ×3 IMPLANT
DRAPE IMP U-DRAPE 54X76 (DRAPES) ×3 IMPLANT
DRAPE OEC MINIVIEW 54X84 (DRAPES) ×3 IMPLANT
DRAPE U-SHAPE 47X51 STRL (DRAPES) ×3 IMPLANT
ELECT REM PT RETURN 9FT ADLT (ELECTROSURGICAL) ×3
ELECTRODE REM PT RTRN 9FT ADLT (ELECTROSURGICAL) ×1 IMPLANT
GAUZE SPONGE 4X4 12PLY STRL (GAUZE/BANDAGES/DRESSINGS) ×3 IMPLANT
GAUZE XEROFORM 1X8 LF (GAUZE/BANDAGES/DRESSINGS) ×3 IMPLANT
GLOVE BIO SURGEON STRL SZ7.5 (GLOVE) IMPLANT
GLOVE BIOGEL PI IND STRL 7.0 (GLOVE) ×1 IMPLANT
GLOVE BIOGEL PI IND STRL 8 (GLOVE) ×2 IMPLANT
GLOVE BIOGEL PI INDICATOR 7.0 (GLOVE) ×2
GLOVE BIOGEL PI INDICATOR 8 (GLOVE) ×4
GLOVE SURG SS PI 7.5 STRL IVOR (GLOVE) ×6 IMPLANT
GOWN STRL REIN XL XLG (GOWN DISPOSABLE) ×3 IMPLANT
GOWN STRL REUS W/ TWL LRG LVL3 (GOWN DISPOSABLE) IMPLANT
GOWN STRL REUS W/ TWL XL LVL3 (GOWN DISPOSABLE) ×1 IMPLANT
GOWN STRL REUS W/TWL LRG LVL3 (GOWN DISPOSABLE)
GOWN STRL REUS W/TWL XL LVL3 (GOWN DISPOSABLE) ×2
K-WIRE .035X4 (WIRE) ×3 IMPLANT
K-WIRE .045X4 (WIRE) ×3 IMPLANT
NEEDLE HYPO 25X1 1.5 SAFETY (NEEDLE) ×3 IMPLANT
NS IRRIG 1000ML POUR BTL (IV SOLUTION) ×3 IMPLANT
PACK BASIN DAY SURGERY FS (CUSTOM PROCEDURE TRAY) ×3 IMPLANT
PAD CAST 4YDX4 CTTN HI CHSV (CAST SUPPLIES) ×1 IMPLANT
PADDING CAST COTTON 4X4 STRL (CAST SUPPLIES) ×2
PADDING CAST SYNTHETIC 4 (CAST SUPPLIES) ×2
PADDING CAST SYNTHETIC 4X4 STR (CAST SUPPLIES) ×1 IMPLANT
PENCIL BUTTON HOLSTER BLD 10FT (ELECTRODE) ×3 IMPLANT
SCREW CORTEX ST 2.0X18 (Screw) ×3 IMPLANT
SLEEVE SCD COMPRESS KNEE MED (MISCELLANEOUS) ×3 IMPLANT
SPLINT FIBERGLASS 4X30 (CAST SUPPLIES) ×3 IMPLANT
SPONGE LAP 18X18 RF (DISPOSABLE) IMPLANT
STOCKINETTE 6  STRL (DRAPES) ×2
STOCKINETTE 6 STRL (DRAPES) ×1 IMPLANT
STRIP CLOSURE SKIN 1/2X4 (GAUZE/BANDAGES/DRESSINGS) IMPLANT
SUCTION FRAZIER HANDLE 10FR (MISCELLANEOUS) ×2
SUCTION TUBE FRAZIER 10FR DISP (MISCELLANEOUS) ×1 IMPLANT
SUT ETHILON 3 0 PS 1 (SUTURE) ×3 IMPLANT
SUT FIBERWIRE 2-0 18 17.9 3/8 (SUTURE) ×3
SUT MNCRL AB 3-0 PS2 18 (SUTURE) ×3 IMPLANT
SUT PDS AB 2-0 CT2 27 (SUTURE) IMPLANT
SUT VIC AB 2-0 SH 27 (SUTURE) ×2
SUT VIC AB 2-0 SH 27XBRD (SUTURE) ×1 IMPLANT
SUT VIC AB 3-0 FS2 27 (SUTURE) ×3 IMPLANT
SUTURE FIBERWR 2-0 18 17.9 3/8 (SUTURE) ×1 IMPLANT
SYR BULB 3OZ (MISCELLANEOUS) ×3 IMPLANT
SYR CONTROL 10ML LL (SYRINGE) ×6 IMPLANT
TOWEL GREEN STERILE FF (TOWEL DISPOSABLE) ×9 IMPLANT
TUBE CONNECTING 20'X1/4 (TUBING) ×1
TUBE CONNECTING 20X1/4 (TUBING) ×2 IMPLANT
UNDERPAD 30X30 (UNDERPADS AND DIAPERS) ×3 IMPLANT

## 2018-08-18 NOTE — H&P (Signed)
Kayla Logan is an 77 y.o. female.   Chief Complaint: Right second hammertoe crossover toe HPI: Kayla Logan is here today for correction of a right crossover toe.  She has pain in the plantar aspect of her foot.  She is been dealing with this for a long time.  She has failed conservative treatment.  Past Medical History:  Diagnosis Date  . Anxiety   . Hypertension   . SUI (stress urinary incontinence, female)   . Vaginal itching     Past Surgical History:  Procedure Laterality Date  . ABDOMINALPLASTY  2006  . CYSTOSCOPY WITH INJECTION  05/21/2012   Procedure: CYSTOSCOPY WITH INJECTION;  Surgeon: Kathi Ludwig, MD;  Location: [facility];  Service: Urology;  Laterality: N/A;  . LIPOSUCTION HEAD / NECK  JAN 2013  . REDUCTION MAMMAPLASTY Bilateral 1995  . VAGINAL HYSTERECTOMY/ ANTERIOR AND POSTERIOR REPAIR/ PUBOVAGINAL SLING  12-11-2003    History reviewed. No pertinent family history. Social History:  reports that she quit smoking about 35 years ago. Her smoking use included cigarettes. She has a 1.50 pack-year smoking history. She has never used smokeless tobacco. She reports that she drinks alcohol. She reports that she does not use drugs.  Allergies:  Allergies  Allergen Reactions  . Epinephrine Other (See Comments)    SEVERE MIGRAINE  . Demerol [Meperidine] Rash  . Latex Rash    Medications Prior to Admission  Medication Sig Dispense Refill  . acetaminophen (TYLENOL) 500 MG tablet Take 1,000 mg by mouth every morning.     . Calcium Carbonate-Vitamin D (CALCIUM + D PO) Take 1 tablet by mouth daily.     . citalopram (CELEXA) 10 MG tablet Take 10 mg by mouth daily.    Marland Kitchen estradiol (ESTRACE) 0.1 MG/GM vaginal cream Place 2 g vaginally 2 (two) times a week. Mondays and fridays    . GLUCOSAMINE-CHONDROITIN PO Take 2 tablets by mouth daily.     Marland Kitchen losartan-hydrochlorothiazide (HYZAAR) 100-25 MG per tablet Take 1 tablet by mouth daily.    . methylPREDNISolone  (MEDROL) 4 MG tablet Take 2 mg by mouth daily.     . propranolol (INDERAL) 20 MG tablet Take 20 mg by mouth 3 (three) times daily.    . Red Yeast Rice Extract (RED YEAST RICE PO) Take 2 tablets by mouth daily.     . traMADol (ULTRAM) 50 MG tablet Take 50 mg by mouth daily.    Marland Kitchen docusate sodium (COLACE) 100 MG capsule Take 300 mg by mouth daily.     Marland Kitchen MAGNESIUM PO Take 1 tablet by mouth daily.     Marland Kitchen oxyCODONE-acetaminophen (PERCOCET/ROXICET) 5-325 MG tablet Take 1-2 tablets by mouth every 6 (six) hours as needed for severe pain. 20 tablet 0  . URELLE (URELLE/URISED) 81 MG TABS Take 1 tablet (81 mg total) by mouth 3 (three) times daily. (Patient not taking: Reported on 11/27/2016) 30 each 2    No results found for this or any previous visit (from the past 48 hour(s)). No results found.  Review of Systems  Constitutional: Negative.   HENT: Negative.   Respiratory: Negative.   Cardiovascular: Negative.   Gastrointestinal: Negative.   Musculoskeletal:       Right 2nd hammertoe  Skin: Negative.   Neurological: Negative.   Psychiatric/Behavioral: Negative.     Blood pressure 135/79, pulse 63, temperature (!) 97.5 F (36.4 C), temperature source Oral, resp. rate 18, height 5\' 5"  (1.651 m), weight 88.2 kg, SpO2 99 %.  Physical Exam  Constitutional: She is oriented to person, place, and time. She appears well-nourished.  HENT:  Head: Normocephalic.  Eyes: Conjunctivae are normal.  Neck: Neck supple.  Cardiovascular: Normal rate.  Respiratory: Effort normal.  GI: Soft.  Musculoskeletal:  2nd hammer toe. Fixed PIP contracture.  MTP subluxated.  Neurological: She is alert and oriented to person, place, and time.  Skin: Skin is warm.  Psychiatric: She has a normal mood and affect.     Assessment/Plan We will proceed with hammertoe correction on the right side will need a Weil osteotomy due to MTP subluxation.  We will also reconstruct the plantar plate.  She understands the risks,  benefits and alternatives to surgery.  She knows she will have 2 pins in her toe for 6 weeks.  She will weight-bear as tolerated in a postop shoe.  Terance Hart, MD 08/18/2018, 12:35 PM

## 2018-08-18 NOTE — Transfer of Care (Signed)
Immediate Anesthesia Transfer of Care Note  Patient: Kayla Logan  Procedure(s) Performed: HAMMERTOE RECONSTRUCTION WITH WEIL OSTEOTOMY, PLANTAR PLATE RECONSTRUCTION (Right Foot)  Patient Location: PACU  Anesthesia Type:MAC  Level of Consciousness: awake, alert , oriented and patient cooperative  Airway & Oxygen Therapy: Patient Spontanous Breathing and Patient connected to face mask oxygen  Post-op Assessment: Report given to RN and Post -op Vital signs reviewed and stable  Post vital signs: Reviewed and stable  Last Vitals:  Vitals Value Taken Time  BP 144/65 08/18/2018  2:25 PM  Temp    Pulse 61 08/18/2018  2:25 PM  Resp 16 08/18/2018  2:25 PM  SpO2 100 % 08/18/2018  2:25 PM  Vitals shown include unvalidated device data.  Last Pain:  Vitals:   08/18/18 1131  TempSrc: Oral  PainSc: 0-No pain      Patients Stated Pain Goal: 0 (22/02/54 2706)  Complications: No apparent anesthesia complications

## 2018-08-18 NOTE — Anesthesia Preprocedure Evaluation (Addendum)
Anesthesia Evaluation  Patient identified by MRN, date of birth, ID band Patient awake    Reviewed: Allergy & Precautions, NPO status , Patient's Chart, lab work & pertinent test results, reviewed documented beta blocker date and time   Airway Mallampati: II  TM Distance: >3 FB Neck ROM: Full    Dental no notable dental hx.    Pulmonary former smoker,    Pulmonary exam normal breath sounds clear to auscultation       Cardiovascular hypertension, Pt. on medications and Pt. on home beta blockers Normal cardiovascular exam Rhythm:Regular Rate:Normal  ECG: Normal sinus rhythm, rate 66 Moderate voltage criteria for LVH, may be normal variant   Neuro/Psych Anxiety negative neurological ROS     GI/Hepatic negative GI ROS, Neg liver ROS,   Endo/Other  negative endocrine ROS  Renal/GU negative Renal ROS     Musculoskeletal negative musculoskeletal ROS (+)   Abdominal (+) + obese,   Peds  Hematology negative hematology ROS (+)   Anesthesia Other Findings RIGHT SECOND HAMMERTOE, PLANTAR PLATE TEAR, METATARSALGIA, SECOND METATARSALPHALANGEAL SUBLUXATION  Reproductive/Obstetrics                            Anesthesia Physical Anesthesia Plan  ASA: III  Anesthesia Plan: MAC   Post-op Pain Management:    Induction: Intravenous  PONV Risk Score and Plan: 2 and Ondansetron, Dexamethasone, Midazolam and Treatment may vary due to age or medical condition  Airway Management Planned: Natural Airway  Additional Equipment:   Intra-op Plan:   Post-operative Plan:   Informed Consent: I have reviewed the patients History and Physical, chart, labs and discussed the procedure including the risks, benefits and alternatives for the proposed anesthesia with the patient or authorized representative who has indicated his/her understanding and acceptance.   Dental advisory given  Plan Discussed with:  CRNA  Anesthesia Plan Comments:         Anesthesia Quick Evaluation

## 2018-08-18 NOTE — Anesthesia Postprocedure Evaluation (Signed)
Anesthesia Post Note  Patient: Kayla Logan  Procedure(s) Performed: HAMMERTOE RECONSTRUCTION WITH WEIL OSTEOTOMY, PLANTAR PLATE RECONSTRUCTION (Right Foot)     Patient location during evaluation: PACU Anesthesia Type: MAC Level of consciousness: awake and alert Pain management: pain level controlled Vital Signs Assessment: post-procedure vital signs reviewed and stable Respiratory status: spontaneous breathing, nonlabored ventilation, respiratory function stable and patient connected to nasal cannula oxygen Cardiovascular status: stable and blood pressure returned to baseline Postop Assessment: no apparent nausea or vomiting Anesthetic complications: no    Last Vitals:  Vitals:   08/18/18 1500 08/18/18 1539  BP: (!) 134/91 (!) 156/83  Pulse: (!) 55 (!) 56  Resp: 19 18  Temp:  36.6 C  SpO2: 98% 97%    Last Pain:  Vitals:   08/18/18 1539  TempSrc:   PainSc: 4                  Ryan P Ellender

## 2018-08-18 NOTE — Discharge Instructions (Signed)
DR. [facility] POST-OP INSTRUCTIONS   Pain Management         5mg  Oxycodone given at 3:23pm 1. The numbing medicine and your leg will last around 3 hours, take a dose of your pain medicine as soon as you feel it wearing off to avoid rebound pain. 2. Keep your foot elevated above heart level.  Make sure that your heel hangs free ('floats'). 3. Take all prescribed medication as directed. 4. If taking narcotic pain medication you may want to use an over-the-counter stool softener to avoid constipation. 5. You may take over-the-counter NSAIDs (ibuprofen, naproxen, etc.) as well as over-the-counter acetaminophen as directed on the packaging as a supplement for your pain and may also use it to wean away from the prescription medication.  Activity ? Weightbearing as tolerated in a shoe ? Keep shoe in place always  First Postoperative Visit 1. Your first postop visit will be at least 2 weeks after surgery.  This should be scheduled when you schedule surgery. 2. If you do not have a postoperative visit scheduled please call [phone] to schedule an appointment. 3. At the appointment your incision will be evaluated for suture removal, x-rays will be obtained if necessary.  General Instructions 1. Swelling is very common after foot surgery.  It often takes 3 months for the foot and ankle to begin to feel comfortable.  Some amount of swelling will persist for 6-12 months. 2. DO NOT change the dressing.  If there is a problem with the dressing (too tight, loose, gets wet, etc.) please contact Dr. Donnie Mesa office. 3. DO NOT get the dressing wet.  For showers you can use an over-the-counter cast cover or wrap a washcloth around the top of your dressing and then cover it with a plastic bag and tape it to your leg. 4. DO NOT soak the incision (no tubs, pools, bath, etc.) until you have approval from Dr. Susa Simmonds.  Contact Dr. Garret Reddish office or go to Emergency Room if: 1. Temperature above 101  F. 2. Increasing pain that is unresponsive to pain medication or elevation 3. Excessive redness or swelling in your foot 4. Dressing problems - excessive bloody drainage, looseness or tightness, or if dressing gets wet 5. Develop pain, swelling, warmth, or discoloration of your calf     Post Anesthesia Home Care Instructions  Activity: Get plenty of rest for the remainder of the day. A responsible individual must stay with you for 24 hours following the procedure.  For the next 24 hours, DO NOT: -Drive a car -Advertising copywriter -Drink alcoholic beverages -Take any medication unless instructed by your physician -Make any legal decisions or sign important papers.  Meals: Start with liquid foods such as gelatin or soup. Progress to regular foods as tolerated. Avoid greasy, spicy, heavy foods. If nausea and/or vomiting occur, drink only clear liquids until the nausea and/or vomiting subsides. Call your physician if vomiting continues.  Special Instructions/Symptoms: Your throat may feel dry or sore from the anesthesia or the breathing tube placed in your throat during surgery. If this causes discomfort, gargle with warm salt water. The discomfort should disappear within 24 hours.  If you had a scopolamine patch placed behind your ear for the management of post- operative nausea and/or vomiting:  1. The medication in the patch is effective for 72 hours, after which it should be removed.  Wrap patch in a tissue and discard in the trash. Wash hands thoroughly with soap and water. 2. You  earlier than 72 hours if you experience unpleasant side effects which may include dry mouth, dizziness or visual disturbances. 3. Avoid touching the patch. Wash your hands with soap and water after contact with the patch.   

## 2018-08-18 NOTE — Anesthesia Procedure Notes (Signed)
Procedure Name: MAC Date/Time: 08/18/2018 1:06 PM Performed by: Signe Colt, CRNA Pre-anesthesia Checklist: Patient identified, Emergency Drugs available, Suction available, Patient being monitored and Timeout performed Patient Re-evaluated:Patient Re-evaluated prior to induction Oxygen Delivery Method: Simple face mask

## 2018-08-18 NOTE — Op Note (Signed)
Kayla Logan female 77 y.o. 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 Second crossover toe with late plantar plate rupture Fixed PIP flexion contracture of the second toe  Implants: 2.0 mm Synthes cortical screw, 0.45 mm and 0.35 mm K wires  Indications:77 y.o. female with second hammertoe and crossover toe with second MTP subluxation of the plantar plate rupture with metatarsalgia.  She had failed conservative treatment in the form of shoe modifications, metatarsal pad, taping technique, Budin splint, activity modifications and therefore was indicated for the above surgeries.  Had a long discussion with her in the office about risks, benefits and alternatives to surgery which included but were not limited to wound healing complications, infection, recurrent hammertoe or crossover toe, continued pain, need for further surgery, nonunion or malunion or less than optimal outcome.  After weighing these risks she opted to proceed with surgical correction.  Procedure Detail: The patient was identified in the preoperative holding area and the operative extremity was marked by myself and the patient.  This was a right lower externally.  Then the consent was signed by myself and the patient.  The patient was taken to the operating room and  placed supine on the operating table.  All bony prominences were well-padded.  Anesthesia was induced without difficulty.  Then using half percent Marcaine I anesthetized the surgical site.  20 cc of half percent Marcaine were used.  This was after cleaning the skin with alcohol.  Antibiotic was given.  The right lower extremity was prepped and draped in usual sterile fashion.  Surgical timeout was performed identifying the right lower extremity in the appropriate surgical procedure to be performed.  Then a 4 inch Esmarch tourniquet was placed.  I began by making an incision over the PIP joint of the second toe extending proximally and in an S configuration over the MTP joint.  This was taken sharply down through skin subcutaneous tissue.  Then using blunt dissection is able to identify the EDL tendon.  The the tendon was mobilized sharply.  I was then able to identify the EDB tendon to the second toe.  The EDB tendon was transected distally at its insertion.  The EDL tendon was Z lengthened.  I was able to identify below that the MTP capsule.  A longitudinal incision was made over the proximal portion of the metatarsal bone just proximal to the MTP joint.  The soft tissue was moved lateral to medial exposing the bone.  Then a transverse incision was made through the periosteum and capsular tissue just proximal to the MTP joint.  This was then lifted off the distal aspect of the metatarsal exposing the metatarsal head of the phalangeal joint.  This was to perform the MTP capsulotomy.  Then the collateral ligaments on the medial and lateral side were released.  There was significant amount of tightness of the medial collateral ligament to the joint.  This was then released.  I was unable to identify the plantar  plate tissue plantarly that had been torn.  The MTP joint was freed up of all capsular and collateral ligament attachments that was causing it to be in a varus position.  Then I turned my attention to the PIP  joint and an elliptical incision was made through the tendon and capsular tissue exposing the PIP joint.  Then the collateral ligaments were released.  The distal aspect of the proximal phalanx and the proximal aspect of the distal phalanx was then removed with a sagittal saw.  Then a a mosquito was used to spread through the plantar plate underneath this joint and exposed the flexor tendons.  These were then brought up through the PIP joint and transected distally.  This completed the flexor tenotomy portion of the case.  Then a 0.45 mm K wire was used and placed in the middle and distal phalanx from a proximal to distal manner.  Then a 0.35 mm K wire was placed.  I then turned my attention back to the MTP joint.  I was able to expose the proximal small aspect of the proximal phalanx and the distal aspect of the metatarsal bone.  Then using a sagittal saw a Weil osteotomy was cut on the distal aspect of the second metatarsal.  This was allowed to shorten a few millimeters.  Then a 2.0 millimeter screw was placed to hold the osteotomy site in place.  Then the metatarsal phalangeal joint was reduced and using a 2-0 FiberWire I was able to grab portion of the plantar plate that had been torn and suture it to the remaining soft tissues on the plantar aspect of the proximal phalanx.  This allowed for correction of the angular deformity of the MTP joint and reconstructed the plantar plate.  I did this on the lateral side first and then with less tension on the medial side to rebalance the soft tissues around the MTP joint.  Then the overlying capsular flap was sutured in place with minimal tension with 3-0 Vicryl stitch.  Then while holding the MTP in a reduced position the 0.45 K wire was placed across the PIP joint performing the PIP arthroplasty.  This was then taken across the MTP joint holding the toe in an acceptable position.  The 0.35 mm K wire was placed across the PIP joint for rotational stability but not  taken across the MTP joint.  Then the flexor digitorum longus tendon was harvested under the proximal phalanx and tagged with a 3-0 Vicryl stitch.  Then the tendon was wrapped around the proximal phalanx and sutured into the extensor digitorum longus tendon on the proximal phalanx performing the deep tendon transfer.  Intraoperative fluoroscopy was used to confirm K wire placement and overall reduction of the MTP joint and position of the toe.  This was found to be acceptable.  I then repaired the extensor tendon that was lengthened.  This was done with 3-0 Vicryl stitch.  I transferred the extensor digitorum brevis to the extensor digitorum longus tendon.  The wound was then irrigated with normal saline.  The deep tissue was closed with 3-0 Vicryl.  The subcutaneous tissue was closed with 3-0 Monocryl in the skin with 3-0 nylon.  This was done in a horizontal mattress fashion.  Zeroform was placed on the wound and a soft dressing was placed.  She was placed in a postoperative shoe.  All counts were correct at the end of the case.  There were no complications.  Post Op Instructions: She will  be weightbearing as tolerated in the postop shoe. She will keep the postop shoe on at all times. She will not remove the dressing. She will reinforce the dressing as needed for bleeding. She does not need DVT prophylaxis as she is weightbearing as tolerated. She was instructed on appropriate pain medication usage. She will follow-up in 2 weeks for dressing removal and wound check.  X-rays will be obtained.  Will likely remove her stitches at that time.  Tourniquette Time: 52 minutes  Estimated Blood Loss:  Minimal         Drains: none  Blood Given: none         Specimens: none       Complications:  * No complications entered in OR log *         Disposition: PACU - hemodynamically stable.         Condition: stable

## 2018-08-19 ENCOUNTER — Encounter (HOSPITAL_BASED_OUTPATIENT_CLINIC_OR_DEPARTMENT_OTHER): Payer: Self-pay | Admitting: Orthopaedic Surgery

## 2018-11-16 ENCOUNTER — Other Ambulatory Visit: Payer: Self-pay | Admitting: Orthopaedic Surgery

## 2018-11-23 ENCOUNTER — Encounter (HOSPITAL_BASED_OUTPATIENT_CLINIC_OR_DEPARTMENT_OTHER): Payer: Self-pay | Admitting: *Deleted

## 2018-11-23 ENCOUNTER — Other Ambulatory Visit: Payer: Self-pay

## 2018-11-26 ENCOUNTER — Encounter (HOSPITAL_BASED_OUTPATIENT_CLINIC_OR_DEPARTMENT_OTHER)
Admission: RE | Admit: 2018-11-26 | Discharge: 2018-11-26 | Disposition: A | Discharge status: Home / Self Care | Payer: Medicare Other | Source: Ambulatory Visit | Attending: Orthopaedic Surgery | Admitting: Orthopaedic Surgery

## 2018-11-26 DIAGNOSIS — Z87891 Personal history of nicotine dependence: Secondary | ICD-10-CM | POA: Diagnosis not present

## 2018-11-26 DIAGNOSIS — Z79899 Other long term (current) drug therapy: Secondary | ICD-10-CM | POA: Diagnosis not present

## 2018-11-26 DIAGNOSIS — Y793 Surgical instruments, materials and orthopedic devices (including sutures) associated with adverse incidents: Secondary | ICD-10-CM | POA: Diagnosis not present

## 2018-11-26 DIAGNOSIS — T8484XA Pain due to internal orthopedic prosthetic devices, implants and grafts, initial encounter: Secondary | ICD-10-CM | POA: Diagnosis not present

## 2018-11-26 DIAGNOSIS — Z885 Allergy status to narcotic agent status: Secondary | ICD-10-CM | POA: Diagnosis not present

## 2018-11-26 DIAGNOSIS — I10 Essential (primary) hypertension: Secondary | ICD-10-CM | POA: Diagnosis not present

## 2018-11-26 DIAGNOSIS — F419 Anxiety disorder, unspecified: Secondary | ICD-10-CM | POA: Diagnosis not present

## 2018-11-26 LAB — BASIC METABOLIC PANEL
Anion gap: 10 (ref 5–15)
BUN: 28 mg/dL — AB (ref 8–23)
CALCIUM: 9.1 mg/dL (ref 8.9–10.3)
CHLORIDE: 105 mmol/L (ref 98–111)
CO2: 26 mmol/L (ref 22–32)
CREATININE: 1.21 mg/dL — AB (ref 0.44–1.00)
GFR calc non Af Amer: 43 mL/min — ABNORMAL LOW (ref >60)
GFR, EST AFRICAN AMERICAN: 50 mL/min — AB (ref >60)
Glucose, Bld: 114 mg/dL — ABNORMAL HIGH (ref 70–99)
Potassium: 3.8 mmol/L (ref 3.5–5.1)
Sodium: 141 mmol/L (ref 135–145)

## 2018-11-29 ENCOUNTER — Encounter (HOSPITAL_BASED_OUTPATIENT_CLINIC_OR_DEPARTMENT_OTHER): Payer: Self-pay | Admitting: Anesthesiology

## 2018-11-29 NOTE — Anesthesia Preprocedure Evaluation (Addendum)
Anesthesia Evaluation  Patient identified by MRN, date of birth, ID band Patient awake    Reviewed: Allergy & Precautions, NPO status , Patient's Chart, lab work & pertinent test results  Airway Mallampati: II  TM Distance: >3 FB Neck ROM: Full    Dental no notable dental hx. (+) Teeth Intact   Pulmonary former smoker,    Pulmonary exam normal breath sounds clear to auscultation       Cardiovascular hypertension, Normal cardiovascular exam Rhythm:Regular Rate:Normal     Neuro/Psych Anxiety    GI/Hepatic negative GI ROS, Neg liver ROS,   Endo/Other  Obesity  Renal/GU Renal InsufficiencyRenal disease Bladder dysfunction  SUI    Musculoskeletal  (+) Arthritis , Right foot pain Painful hardware right foot   Abdominal (+) + obese,   Peds  Hematology negative hematology ROS (+)   Anesthesia Other Findings   Reproductive/Obstetrics                          Anesthesia Physical Anesthesia Plan  ASA: II  Anesthesia Plan: MAC   Post-op Pain Management:    Induction: Intravenous  PONV Risk Score and Plan: 3 and Ondansetron, Dexamethasone, Treatment may vary due to age or medical condition and Propofol infusion  Airway Management Planned: Simple Face Mask  Additional Equipment:   Intra-op Plan:   Post-operative Plan:   Informed Consent: I have reviewed the patients History and Physical, chart, labs and discussed the procedure including the risks, benefits and alternatives for the proposed anesthesia with the patient or authorized representative who has indicated his/her understanding and acceptance.       Plan Discussed with: CRNA and Surgeon  Anesthesia Plan Comments:        Anesthesia Quick Evaluation

## 2018-11-30 ENCOUNTER — Ambulatory Visit (HOSPITAL_BASED_OUTPATIENT_CLINIC_OR_DEPARTMENT_OTHER): Payer: Medicare Other | Admitting: Anesthesiology

## 2018-11-30 ENCOUNTER — Encounter (HOSPITAL_BASED_OUTPATIENT_CLINIC_OR_DEPARTMENT_OTHER): Payer: Self-pay | Admitting: Certified Registered"

## 2018-11-30 ENCOUNTER — Encounter (HOSPITAL_BASED_OUTPATIENT_CLINIC_OR_DEPARTMENT_OTHER)
Admission: RE | Disposition: A | Discharge status: Home / Self Care | Payer: Self-pay | Source: Home / Self Care | Attending: Orthopaedic Surgery

## 2018-11-30 ENCOUNTER — Other Ambulatory Visit: Payer: Self-pay

## 2018-11-30 ENCOUNTER — Ambulatory Visit (HOSPITAL_BASED_OUTPATIENT_CLINIC_OR_DEPARTMENT_OTHER)
Admission: RE | Admit: 2018-11-30 | Discharge: 2018-11-30 | Disposition: A | Discharge status: Home / Self Care | Payer: Medicare Other | Attending: Orthopaedic Surgery | Admitting: Orthopaedic Surgery

## 2018-11-30 DIAGNOSIS — T8484XA Pain due to internal orthopedic prosthetic devices, implants and grafts, initial encounter: Secondary | ICD-10-CM | POA: Diagnosis not present

## 2018-11-30 DIAGNOSIS — F419 Anxiety disorder, unspecified: Secondary | ICD-10-CM | POA: Insufficient documentation

## 2018-11-30 DIAGNOSIS — Z87891 Personal history of nicotine dependence: Secondary | ICD-10-CM | POA: Insufficient documentation

## 2018-11-30 DIAGNOSIS — Y793 Surgical instruments, materials and orthopedic devices (including sutures) associated with adverse incidents: Secondary | ICD-10-CM | POA: Insufficient documentation

## 2018-11-30 DIAGNOSIS — Z79899 Other long term (current) drug therapy: Secondary | ICD-10-CM | POA: Diagnosis not present

## 2018-11-30 DIAGNOSIS — I10 Essential (primary) hypertension: Secondary | ICD-10-CM | POA: Insufficient documentation

## 2018-11-30 DIAGNOSIS — Z885 Allergy status to narcotic agent status: Secondary | ICD-10-CM | POA: Insufficient documentation

## 2018-11-30 HISTORY — PX: HARDWARE REMOVAL: SHX979

## 2018-11-30 SURGERY — REMOVAL, HARDWARE
Anesthesia: Monitor Anesthesia Care | Site: Foot | Laterality: Right

## 2018-11-30 MED ORDER — OXYCODONE HCL 5 MG/5ML PO SOLN: 5.0000 mg | Freq: Once | ORAL | Status: DC | PRN

## 2018-11-30 MED ORDER — LIDOCAINE HCL (PF) 1 % IJ SOLN
INTRAMUSCULAR | Status: AC
  Filled 2018-11-30: qty 30

## 2018-11-30 MED ORDER — POVIDONE-IODINE 10 % EX SWAB: 2.0000 "application " | Freq: Once | CUTANEOUS | Status: DC

## 2018-11-30 MED ORDER — BUPIVACAINE HCL (PF) 0.5 % IJ SOLN
INTRAMUSCULAR | Status: AC
  Filled 2018-11-30: qty 30

## 2018-11-30 MED ORDER — SCOPOLAMINE 1 MG/3DAYS TD PT72: 1.0000 | MEDICATED_PATCH | Freq: Once | TRANSDERMAL | Status: DC | PRN

## 2018-11-30 MED ORDER — OXYCODONE HCL 5 MG PO TABS: 5.0000 mg | ORAL_TABLET | Freq: Once | ORAL | Status: DC | PRN

## 2018-11-30 MED ORDER — LIDOCAINE HCL (CARDIAC) PF 100 MG/5ML IV SOSY
PREFILLED_SYRINGE | INTRAVENOUS | Status: DC | PRN
  Administered 2018-11-30: 60 mg via INTRAVENOUS

## 2018-11-30 MED ORDER — FENTANYL CITRATE (PF) 100 MCG/2ML IJ SOLN
INTRAMUSCULAR | Status: AC
  Filled 2018-11-30: qty 2

## 2018-11-30 MED ORDER — LACTATED RINGERS IV SOLN
INTRAVENOUS | Status: DC
  Administered 2018-11-30: 07:00:00 via INTRAVENOUS

## 2018-11-30 MED ORDER — PROPOFOL 500 MG/50ML IV EMUL
INTRAVENOUS | Status: DC | PRN
  Administered 2018-11-30: 75 ug/kg/min via INTRAVENOUS

## 2018-11-30 MED ORDER — FENTANYL CITRATE (PF) 100 MCG/2ML IJ SOLN: 25.0000 ug | INTRAMUSCULAR | Status: DC | PRN

## 2018-11-30 MED ORDER — CEFAZOLIN SODIUM-DEXTROSE 2-4 GM/100ML-% IV SOLN
2.0000 g | INTRAVENOUS | Status: AC
  Administered 2018-11-30: 2 g via INTRAVENOUS

## 2018-11-30 MED ORDER — MIDAZOLAM HCL 2 MG/2ML IJ SOLN: 1.0000 mg | INTRAMUSCULAR | Status: DC | PRN

## 2018-11-30 MED ORDER — BUPIVACAINE HCL (PF) 0.25 % IJ SOLN
INTRAMUSCULAR | Status: DC | PRN
  Administered 2018-11-30: 10 mL

## 2018-11-30 MED ORDER — OXYCODONE HCL 5 MG PO TABS: 5.0000 mg | ORAL_TABLET | Freq: Four times a day (QID) | ORAL | 0 refills | Status: AC | PRN

## 2018-11-30 MED ORDER — METOCLOPRAMIDE HCL 5 MG/ML IJ SOLN: 10.0000 mg | Freq: Once | INTRAMUSCULAR | Status: DC | PRN

## 2018-11-30 MED ORDER — CEFAZOLIN SODIUM-DEXTROSE 2-4 GM/100ML-% IV SOLN
INTRAVENOUS | Status: AC
  Filled 2018-11-30: qty 100

## 2018-11-30 MED ORDER — BUPIVACAINE HCL (PF) 0.25 % IJ SOLN
INTRAMUSCULAR | Status: AC
  Filled 2018-11-30: qty 30

## 2018-11-30 MED ORDER — FENTANYL CITRATE (PF) 100 MCG/2ML IJ SOLN
50.0000 ug | INTRAMUSCULAR | Status: DC | PRN
  Administered 2018-11-30 (×2): 50 ug via INTRAVENOUS

## 2018-11-30 SURGICAL SUPPLY — 56 items
BANDAGE ACE 4X5 VEL STRL LF (GAUZE/BANDAGES/DRESSINGS) ×3 IMPLANT
BANDAGE ACE 6X5 VEL STRL LF (GAUZE/BANDAGES/DRESSINGS) IMPLANT
BANDAGE ESMARK 6X9 LF (GAUZE/BANDAGES/DRESSINGS) ×1 IMPLANT
BENZOIN TINCTURE PRP APPL 2/3 (GAUZE/BANDAGES/DRESSINGS) IMPLANT
BLADE SURG 15 STRL LF DISP TIS (BLADE) ×2 IMPLANT
BLADE SURG 15 STRL SS (BLADE) ×4
BNDG ESMARK 6X9 LF (GAUZE/BANDAGES/DRESSINGS) ×3
CHLORAPREP W/TINT 26ML (MISCELLANEOUS) ×3 IMPLANT
CLOSURE WOUND 1/2 X4 (GAUZE/BANDAGES/DRESSINGS) ×1
COVER BACK TABLE 60X90IN (DRAPES) ×3 IMPLANT
COVER WAND RF STERILE (DRAPES) IMPLANT
CUFF TOURNIQUET SINGLE 34IN LL (TOURNIQUET CUFF) IMPLANT
DECANTER SPIKE VIAL GLASS SM (MISCELLANEOUS) IMPLANT
DRAPE EXTREMITY T 121X128X90 (DISPOSABLE) ×3 IMPLANT
DRAPE IMP U-DRAPE 54X76 (DRAPES) ×3 IMPLANT
DRAPE U-SHAPE 47X51 STRL (DRAPES) ×3 IMPLANT
ELECT REM PT RETURN 9FT ADLT (ELECTROSURGICAL) ×3
ELECTRODE REM PT RTRN 9FT ADLT (ELECTROSURGICAL) ×1 IMPLANT
GAUZE SPONGE 4X4 12PLY STRL (GAUZE/BANDAGES/DRESSINGS) ×3 IMPLANT
GAUZE XEROFORM 1X8 LF (GAUZE/BANDAGES/DRESSINGS) ×3 IMPLANT
GLOVE BIOGEL PI IND STRL 7.0 (GLOVE) ×1 IMPLANT
GLOVE BIOGEL PI IND STRL 8 (GLOVE) ×3 IMPLANT
GLOVE BIOGEL PI INDICATOR 7.0 (GLOVE) ×2
GLOVE BIOGEL PI INDICATOR 8 (GLOVE) ×6
GLOVE SURG SS PI 7.0 STRL IVOR (GLOVE) ×3 IMPLANT
GLOVE SURG SS PI 7.5 STRL IVOR (GLOVE) ×6 IMPLANT
GLOVE SURG SYN 8.0 (GLOVE) ×3 IMPLANT
GOWN STRL REIN XL XLG (GOWN DISPOSABLE) ×3 IMPLANT
GOWN STRL REUS W/ TWL LRG LVL3 (GOWN DISPOSABLE) ×1 IMPLANT
GOWN STRL REUS W/ TWL XL LVL3 (GOWN DISPOSABLE) ×1 IMPLANT
GOWN STRL REUS W/TWL LRG LVL3 (GOWN DISPOSABLE) ×2
GOWN STRL REUS W/TWL XL LVL3 (GOWN DISPOSABLE) ×2
NS IRRIG 1000ML POUR BTL (IV SOLUTION) ×3 IMPLANT
PACK BASIN DAY SURGERY FS (CUSTOM PROCEDURE TRAY) ×3 IMPLANT
PAD CAST 4YDX4 CTTN HI CHSV (CAST SUPPLIES) ×1 IMPLANT
PADDING CAST COTTON 4X4 STRL (CAST SUPPLIES) ×2
PADDING CAST SYNTHETIC 4 (CAST SUPPLIES) ×2
PADDING CAST SYNTHETIC 4X4 STR (CAST SUPPLIES) ×1 IMPLANT
PENCIL BUTTON HOLSTER BLD 10FT (ELECTRODE) ×3 IMPLANT
SLEEVE SCD COMPRESS KNEE MED (MISCELLANEOUS) ×3 IMPLANT
SPLINT FIBERGLASS 4X30 (CAST SUPPLIES) ×3 IMPLANT
SPONGE LAP 18X18 RF (DISPOSABLE) IMPLANT
STOCKINETTE 6  STRL (DRAPES) ×2
STOCKINETTE 6 STRL (DRAPES) ×1 IMPLANT
STRIP CLOSURE SKIN 1/2X4 (GAUZE/BANDAGES/DRESSINGS) ×2 IMPLANT
SUCTION FRAZIER HANDLE 10FR (MISCELLANEOUS)
SUCTION TUBE FRAZIER 10FR DISP (MISCELLANEOUS) IMPLANT
SUT ETHILON 3 0 PS 1 (SUTURE) IMPLANT
SUT MNCRL AB 3-0 PS2 18 (SUTURE) ×3 IMPLANT
SUT PDS AB 2-0 CT2 27 (SUTURE) IMPLANT
SUT VIC AB 3-0 FS2 27 (SUTURE) IMPLANT
SYR BULB 3OZ (MISCELLANEOUS) ×3 IMPLANT
TOWEL GREEN STERILE FF (TOWEL DISPOSABLE) ×6 IMPLANT
TUBE CONNECTING 20'X1/4 (TUBING)
TUBE CONNECTING 20X1/4 (TUBING) IMPLANT
UNDERPAD 30X30 (UNDERPADS AND DIAPERS) ×3 IMPLANT

## 2018-11-30 NOTE — Op Note (Signed)
  Kayla Logan female 78 y.o. 11/30/2018  PreOperative Diagnosis: Symptomatic orthopedic hardware right foot  PostOperative Diagnosis: Same  PROCEDURE: Deep hardware removal right foot  SURGEON: Radene Journey, MD  ASSISTANT: None  ANESTHESIA: MAC with local anesthesia  FINDINGS: Retained orthopedic hardware  IMPLANTS: None  INDICATIONS:77 y.o. female had prior hammertoe correction of the second toe on the right.  She had a screw from the Weil osteotomy that was prominent plantarly.  Discussed pain with every step.  Given that she was indicated for hardware removal.  She understood the risk, benefits and alternatives of surgery which were discussed in detail.  The risk include wound healing complication, infection, nonunion, need for further surgery.  We also discussed the possibility of continued pain and deformity of second toe.  After weighing these risks he opted to proceed with surgery.  PROCEDURE: Patient was identified the preoperative holding area.  The right leg was marked by myself.  The consent was signed myself and the patient.  She is taken the operative suite placed supine operative table.  Bony prominences well-padded.  A bump was placed on the right hip.  MAC anesthesia was induced without difficulty.  Then the second toe was anesthetized using quarter percent Marcaine plain.  This was after cleaning the skin with alcohol.  Prior to the block a block timeout was performed.  The right lower extremity was prepped and draped in the usual sterile fashion.  An Esmarch tourniquet was placed.  A surgical timeout was performed.  Preoperative antibiotics were given.  Began by making a longitudinal incision overlying the previous scar over the second MTP joint.  This taken sharply down through skin subcutaneous tissue.  Then using blunt dissection the EDL tendon was mobilized.  Then the underlying bone was palpated and this was taken sharply down to the distal second metatarsal.  The  tissue was elevated medially and laterally and the screw was identified.  Then using a screwdriver the screw was backed out.  The wound was then irrigated with normal saline.  The tourniquet was released and hemostasis was obtained.  The deep tissue was closed with 3-0 Monocryl and a 3-0 Monocryl was used for subcuticular running stitch.  Soft dressing was placed.  She tolerated this well.  There were no complications.  POST OPERATIVE INSTRUCTIONS: Keep dressing intact until follow-up Weightbearing as tolerated in postoperative shoe Pain medication given No need for DVT prophylaxis and is ambulatory patient Call the office with concerns. Follow-up in 2 weeks for wound check.  TOURNIQUET TIME:13 minutes  BLOOD LOSS:  Minimal         DRAINS: none         SPECIMEN: none       COMPLICATIONS:  * No complications entered in OR log *         Disposition: PACU - hemodynamically stable.         Condition: stable

## 2018-11-30 NOTE — Anesthesia Postprocedure Evaluation (Signed)
Anesthesia Post Note  Patient: Kayla Logan  Procedure(s) Performed: RIGHT FOOT HARDWARE REMOVAL (Right Foot)     Patient location during evaluation: PACU Anesthesia Type: MAC Level of consciousness: awake and alert and oriented Pain management: pain level controlled Vital Signs Assessment: post-procedure vital signs reviewed and stable Respiratory status: spontaneous breathing, nonlabored ventilation and respiratory function stable Cardiovascular status: stable and blood pressure returned to baseline Postop Assessment: no apparent nausea or vomiting Anesthetic complications: no    Last Vitals:  Vitals:   11/30/18 0830 11/30/18 0845  BP: (!) 144/77 (!) 157/73  Pulse: 61 (!) 58  Resp: 16 16  Temp:  36.6 C  SpO2: 100% 100%    Last Pain:  Vitals:   11/30/18 0845  TempSrc:   PainSc: 0-No pain                 Juelle Dickmann A.

## 2018-11-30 NOTE — Discharge Instructions (Signed)
Post Anesthesia Home Care Instructions  Activity: Get plenty of rest for the remainder of the day. A responsible individual must stay with you for 24 hours following the procedure.  For the next 24 hours, DO NOT: -Drive a car -Advertising copywriter -Drink alcoholic beverages -Take any medication unless instructed by your physician -Make any legal decisions or sign important papers.  Meals: Start with liquid foods such as gelatin or soup. Progress to regular foods as tolerated. Avoid greasy, spicy, heavy foods. If nausea and/or vomiting occur, drink only clear liquids until the nausea and/or vomiting subsides. Call your physician if vomiting continues.  Special Instructions/Symptoms: Your throat may feel dry or sore from the anesthesia or the breathing tube placed in your throat during surgery. If this causes discomfort, gargle with warm salt water. The discomfort should disappear within 24 hours.  If you had a scopolamine patch placed behind your ear for the management of post- operative nausea and/or vomiting:  1. The medication in the patch is effective for 72 hours, after which it should be removed.  Wrap patch in a tissue and discard in the trash. Wash hands thoroughly with soap and water. 2. You may remove the patch earlier than 72 hours if you experience unpleasant side effects which may include dry mouth, dizziness or visual disturbances. 3. Avoid touching the patch. Wash your hands with soap and water after contact with the patch.    DR. Susa Simmonds [facility] POST-OP INSTRUCTIONS   Pain Management 1. The numbing medicine and your leg will last around 6 hours, take a dose of your pain medicine as soon as you feel it wearing off to avoid rebound pain. 2. Keep your foot elevated above heart level.  Make sure that your heel hangs free ('floats'). 3. Take all prescribed medication as directed. 4. If taking narcotic pain medication you may want to use an over-the-counter stool  softener to avoid constipation. 5. You may take over-the-counter NSAIDs (ibuprofen, naproxen, etc.) as well as over-the-counter acetaminophen as directed on the packaging as a supplement for your pain and may also use it to wean away from the prescription medication.  Activity ? Weightbearing as tolerated in a boot ? Non-weightbearing ? Postoperatively, you will be placed into a splint which stays on for 2 weeks and then will be changed at your first postop visit.  First Postoperative Visit 1. Your first postop visit will be at least 2 weeks after surgery.  This should be scheduled when you schedule surgery. 2. If you do not have a postoperative visit scheduled please call [phone] to schedule an appointment. 3. At the appointment your incision will be evaluated for suture removal, x-rays will be obtained if necessary.  General Instructions 1. Swelling is very common after foot and ankle surgery.  It often takes 3 months for the foot and ankle to begin to feel comfortable.  Some amount of swelling will persist for 6-12 months. 2. DO NOT change the dressing.  If there is a problem with the dressing (too tight, loose, gets wet, etc.) please contact Dr. Donnie Mesa office. 3. DO NOT get the dressing wet.  For showers you can use an over-the-counter cast cover or wrap a washcloth around the top of your dressing and then cover it with a plastic bag and tape it to your leg. 4. DO NOT soak the incision (no tubs, pools, bath, etc.) until you have approval from Dr. Susa Simmonds.  Contact Dr. Garret Reddish office or go to Emergency Room if:  1. Temperature above 101 F. 2. Increasing pain that is unresponsive to pain medication or elevation 3. Excessive redness or swelling in your foot 4. Dressing problems - excessive bloody drainage, looseness or tightness, or if dressing gets wet 5. Develop pain, swelling, warmth, or discoloration of your calf

## 2018-11-30 NOTE — Transfer of Care (Signed)
Immediate Anesthesia Transfer of Care Note  Patient: Kayla Logan  Procedure(s) Performed: RIGHT FOOT HARDWARE REMOVAL (Right Foot)  Patient Location: PACU  Anesthesia Type:MAC  Level of Consciousness: awake, alert , oriented and patient cooperative  Airway & Oxygen Therapy: Patient Spontanous Breathing and Patient connected to face mask oxygen  Post-op Assessment: Report given to RN and Post -op Vital signs reviewed and stable  Post vital signs: Reviewed and stable  Last Vitals:  Vitals Value Taken Time  BP    Temp    Pulse 68 11/30/2018  8:11 AM  Resp 20 11/30/2018  8:11 AM  SpO2 100 % 11/30/2018  8:11 AM  Vitals shown include unvalidated device data.  Last Pain:  Vitals:   11/30/18 0637  TempSrc: Oral  PainSc: 0-No pain         Complications: No apparent anesthesia complications

## 2018-11-30 NOTE — Progress Notes (Signed)
Pt complains of pain in calf area above surgical incision.  ROM normal, compression wrap CDI, slight tingling to toes, cap refill brisk, betadine tinged pink skin noted. Pt expresses concerns over blood clot. Dr. Emeline Gins made aware, in to see patient and discuss situation.  Area of pain coincides with tourniquet application site.  Reassured pt and instructed her to call MD if pain worsens or she has further concerns. Okay to discharge home.

## 2018-11-30 NOTE — H&P (Signed)
Kayla Logan is an 78 y.o. female.   Chief Complaint: Right plantar foot pain with symptomatic orthopedic hardware HPI: Patient had second hammertoe and crossover toe correction over 3 months ago.  The Weil osteotomy site settled and the screw became prominent plantarly.  It is been given her pain with every step.  She was seen in clinic and I felt as though the screw was palpable.  She would like the screw removed.  Overall she is pleased with the correction.  She is here today for surgery.  She denies any new complaints.  Past Medical History:  Diagnosis Date  . Anxiety   . Hypertension   . SUI (stress urinary incontinence, female)   . Vaginal itching     Past Surgical History:  Procedure Laterality Date  . ABDOMINAL HYSTERECTOMY    . ABDOMINALPLASTY  2006  . CYSTOSCOPY WITH INJECTION  05/21/2012   Procedure: CYSTOSCOPY WITH INJECTION;  Surgeon: Kathi Ludwig, MD;  Location: [facility];  Service: Urology;  Laterality: N/A;  . HAMMERTOE RECONSTRUCTION WITH WEIL OSTEOTOMY Right 08/18/2018   Procedure: HAMMERTOE RECONSTRUCTION WITH WEIL OSTEOTOMY, PLANTAR PLATE RECONSTRUCTION;  Surgeon: Terance Hart, MD;  Location: [facility];  Service: Orthopedics;  Laterality: Right;  . LIPOSUCTION HEAD / NECK  JAN 2013  . REDUCTION MAMMAPLASTY Bilateral 1995  . VAGINAL HYSTERECTOMY/ ANTERIOR AND POSTERIOR REPAIR/ PUBOVAGINAL SLING  12-11-2003    History reviewed. No pertinent family history. Social History:  reports that she quit smoking about 35 years ago. Her smoking use included cigarettes. She has a 1.50 pack-year smoking history. She has never used smokeless tobacco. She reports current alcohol use. She reports that she does not use drugs.  Allergies:  Allergies  Allergen Reactions  . Epinephrine Other (See Comments)    SEVERE MIGRAINE  . Demerol [Meperidine] Rash  . Latex Rash    Medications Prior to Admission  Medication Sig Dispense Refill   . acetaminophen (TYLENOL) 500 MG tablet Take 1,000 mg by mouth every morning.     . Calcium Carbonate-Vitamin D (CALCIUM + D PO) Take 1 tablet by mouth daily.     . citalopram (CELEXA) 10 MG tablet Take 10 mg by mouth daily.    Marland Kitchen estradiol (ESTRACE) 0.1 MG/GM vaginal cream Place 2 g vaginally 2 (two) times a week. Mondays and fridays    . GLUCOSAMINE-CHONDROITIN PO Take 2 tablets by mouth daily.     Marland Kitchen losartan-hydrochlorothiazide (HYZAAR) 100-25 MG per tablet Take 1 tablet by mouth daily.    . methylPREDNISolone (MEDROL) 2 MG tablet Take 2 mg by mouth daily.    . propranolol (INDERAL) 20 MG tablet Take 20 mg by mouth 3 (three) times daily.    . Red Yeast Rice Extract (RED YEAST RICE PO) Take 2 tablets by mouth daily.     . traMADol (ULTRAM) 50 MG tablet Take 50 mg by mouth daily.      No results found for this or any previous visit (from the past 48 hour(s)). No results found.  Review of Systems  Constitutional: Negative.   HENT: Negative.   Eyes: Negative.   Respiratory: Negative.   Cardiovascular: Negative.   Gastrointestinal: Negative.   Musculoskeletal:       Right plantar foot pain  Skin: Negative.   Neurological: Negative.   Endo/Heme/Allergies: Negative.   Psychiatric/Behavioral: Negative.     Blood pressure (!) 153/83, pulse (!) 56, temperature 97.6 F (36.4 C), temperature source Oral, resp. rate 20,  height 5\' 5"  (1.651 m), weight 87.5 kg, SpO2 100 %. Physical Exam  Constitutional: She appears well-developed.  HENT:  Head: Normocephalic.  Eyes: Conjunctivae are normal.  Neck: Neck supple.  Cardiovascular: Normal rate.  Respiratory: Effort normal.  GI: Soft.  Musculoskeletal:     Comments: Right foot prior surgical scar well healed. TTP plantar foot.  Prominent hardware palpable.   Neurological: She is alert.  Skin: Skin is warm.  Psychiatric: She has a normal mood and affect.     Assessment/Plan We will plan for hardware removal of the right foot.  This  will be done under local with monitored anesthesia care.  Postoperatively she will be weightbearing as tolerated in postoperative shoe.  We discussed the risk, benefits alternatives of surgery which included but were not limited to wound healing complications, infection, need for second surgery, damage surrounding structures, continued pain, less than optimal outcome.  We also discussed the perioperative and anesthetic risks.  We will proceed as planned.  Terance Harthristopher R Adair, MD 11/30/2018, 6:58 AM

## 2018-11-30 NOTE — Anesthesia Procedure Notes (Signed)
Procedure Name: MAC Date/Time: 11/30/2018 7:49 AM Performed by: Signe Colt, CRNA Pre-anesthesia Checklist: Patient identified, Emergency Drugs available, Suction available, Patient being monitored and Timeout performed Patient Re-evaluated:Patient Re-evaluated prior to induction Oxygen Delivery Method: Simple face mask

## 2018-12-01 ENCOUNTER — Encounter (HOSPITAL_BASED_OUTPATIENT_CLINIC_OR_DEPARTMENT_OTHER): Payer: Self-pay | Admitting: Orthopaedic Surgery

## 2019-09-14 ENCOUNTER — Other Ambulatory Visit: Payer: Self-pay | Admitting: Orthopedic Surgery

## 2019-10-18 ENCOUNTER — Other Ambulatory Visit: Payer: Self-pay | Admitting: Obstetrics and Gynecology

## 2019-10-18 DIAGNOSIS — M8580 Other specified disorders of bone density and structure, unspecified site: Secondary | ICD-10-CM

## 2019-10-21 ENCOUNTER — Ambulatory Visit: Admit: 2019-10-21 | Payer: Medicare Other | Admitting: Orthopedic Surgery

## 2019-10-21 SURGERY — ARTHROPLASTY, KNEE, TOTAL
Anesthesia: Spinal | Site: Knee | Laterality: Right

## 2019-10-31 ENCOUNTER — Other Ambulatory Visit: Payer: Self-pay | Admitting: Obstetrics and Gynecology

## 2019-10-31 DIAGNOSIS — Z1231 Encounter for screening mammogram for malignant neoplasm of breast: Secondary | ICD-10-CM

## 2019-12-08 ENCOUNTER — Ambulatory Visit: Payer: Medicare Other

## 2019-12-17 ENCOUNTER — Ambulatory Visit: Payer: Medicare Other

## 2019-12-29 ENCOUNTER — Ambulatory Visit: Payer: Medicare Other

## 2020-01-12 ENCOUNTER — Other Ambulatory Visit: Payer: Medicare Other

## 2020-01-12 ENCOUNTER — Ambulatory Visit: Payer: Medicare Other

## 2020-02-27 ENCOUNTER — Ambulatory Visit: Payer: Medicare Other

## 2020-02-27 ENCOUNTER — Other Ambulatory Visit: Payer: Medicare Other

## 2020-04-25 ENCOUNTER — Other Ambulatory Visit: Payer: Self-pay

## 2020-04-25 ENCOUNTER — Ambulatory Visit
Admission: RE | Admit: 2020-04-25 | Discharge: 2020-04-25 | Disposition: A | Discharge status: Home / Self Care | Payer: Medicare Other | Source: Ambulatory Visit | Attending: Obstetrics and Gynecology | Admitting: Obstetrics and Gynecology

## 2020-04-25 ENCOUNTER — Other Ambulatory Visit: Payer: Self-pay | Admitting: Obstetrics and Gynecology

## 2020-04-25 DIAGNOSIS — Z1231 Encounter for screening mammogram for malignant neoplasm of breast: Secondary | ICD-10-CM

## 2020-04-25 DIAGNOSIS — M8580 Other specified disorders of bone density and structure, unspecified site: Secondary | ICD-10-CM

## 2020-06-01 ENCOUNTER — Inpatient Hospital Stay (HOSPITAL_COMMUNITY)
Admission: EM | Admit: 2020-06-01 | Discharge: 2020-06-05 | DRG: 641 | Disposition: A | Discharge status: Skilled Nursing Facility | Payer: Medicare Other | Attending: Internal Medicine | Admitting: Internal Medicine

## 2020-06-01 ENCOUNTER — Emergency Department (HOSPITAL_COMMUNITY): Payer: Medicare Other

## 2020-06-01 DIAGNOSIS — M1990 Unspecified osteoarthritis, unspecified site: Secondary | ICD-10-CM | POA: Diagnosis present

## 2020-06-01 DIAGNOSIS — E669 Obesity, unspecified: Secondary | ICD-10-CM | POA: Diagnosis present

## 2020-06-01 DIAGNOSIS — Z87891 Personal history of nicotine dependence: Secondary | ICD-10-CM

## 2020-06-01 DIAGNOSIS — M25669 Stiffness of unspecified knee, not elsewhere classified: Secondary | ICD-10-CM

## 2020-06-01 DIAGNOSIS — R55 Syncope and collapse: Secondary | ICD-10-CM | POA: Diagnosis present

## 2020-06-01 DIAGNOSIS — W19XXXA Unspecified fall, initial encounter: Secondary | ICD-10-CM | POA: Diagnosis not present

## 2020-06-01 DIAGNOSIS — M797 Fibromyalgia: Secondary | ICD-10-CM | POA: Diagnosis present

## 2020-06-01 DIAGNOSIS — Z20822 Contact with and (suspected) exposure to covid-19: Secondary | ICD-10-CM | POA: Diagnosis present

## 2020-06-01 DIAGNOSIS — M25561 Pain in right knee: Secondary | ICD-10-CM | POA: Diagnosis present

## 2020-06-01 DIAGNOSIS — M1711 Unilateral primary osteoarthritis, right knee: Secondary | ICD-10-CM | POA: Diagnosis present

## 2020-06-01 DIAGNOSIS — E860 Dehydration: Secondary | ICD-10-CM | POA: Diagnosis not present

## 2020-06-01 DIAGNOSIS — M25461 Effusion, right knee: Secondary | ICD-10-CM | POA: Diagnosis present

## 2020-06-01 DIAGNOSIS — Z79891 Long term (current) use of opiate analgesic: Secondary | ICD-10-CM

## 2020-06-01 DIAGNOSIS — Z96651 Presence of right artificial knee joint: Secondary | ICD-10-CM | POA: Diagnosis present

## 2020-06-01 DIAGNOSIS — I10 Essential (primary) hypertension: Secondary | ICD-10-CM | POA: Diagnosis present

## 2020-06-01 DIAGNOSIS — F419 Anxiety disorder, unspecified: Secondary | ICD-10-CM | POA: Diagnosis present

## 2020-06-01 DIAGNOSIS — N393 Stress incontinence (female) (male): Secondary | ICD-10-CM | POA: Diagnosis present

## 2020-06-01 DIAGNOSIS — Z90710 Acquired absence of both cervix and uterus: Secondary | ICD-10-CM

## 2020-06-01 DIAGNOSIS — Z79899 Other long term (current) drug therapy: Secondary | ICD-10-CM

## 2020-06-01 DIAGNOSIS — Y92009 Unspecified place in unspecified non-institutional (private) residence as the place of occurrence of the external cause: Secondary | ICD-10-CM

## 2020-06-01 LAB — CBC
HCT: 30.1 % — ABNORMAL LOW (ref 36.0–46.0)
Hemoglobin: 9.5 g/dL — ABNORMAL LOW (ref 12.0–15.0)
MCH: 33.5 pg (ref 26.0–34.0)
MCHC: 31.6 g/dL (ref 30.0–36.0)
MCV: 106 fL — ABNORMAL HIGH (ref 80.0–100.0)
Platelets: 216 10*3/uL (ref 150–400)
RBC: 2.84 MIL/uL — ABNORMAL LOW (ref 3.87–5.11)
RDW: 14.5 % (ref 11.5–15.5)
WBC: 13 10*3/uL — ABNORMAL HIGH (ref 4.0–10.5)
nRBC: 0.2 % (ref 0.0–0.2)

## 2020-06-01 LAB — CK: Total CK: 117 U/L (ref 38–234)

## 2020-06-01 LAB — BASIC METABOLIC PANEL
Anion gap: 10 (ref 5–15)
BUN: 36 mg/dL — ABNORMAL HIGH (ref 8–23)
CO2: 28 mmol/L (ref 22–32)
Calcium: 8.8 mg/dL — ABNORMAL LOW (ref 8.9–10.3)
Chloride: 103 mmol/L (ref 98–111)
Creatinine, Ser: 1.24 mg/dL — ABNORMAL HIGH (ref 0.44–1.00)
GFR calc Af Amer: 48 mL/min — ABNORMAL LOW (ref >60)
GFR calc non Af Amer: 42 mL/min — ABNORMAL LOW (ref >60)
Glucose, Bld: 108 mg/dL — ABNORMAL HIGH (ref 70–99)
Potassium: 4 mmol/L (ref 3.5–5.1)
Sodium: 141 mmol/L (ref 135–145)

## 2020-06-01 MED ORDER — ONDANSETRON HCL 4 MG/2ML IJ SOLN: 4.0000 mg | Freq: Four times a day (QID) | INTRAMUSCULAR | Status: DC | PRN

## 2020-06-01 MED ORDER — PROPRANOLOL HCL 20 MG PO TABS
20.0000 mg | ORAL_TABLET | Freq: Three times a day (TID) | ORAL | Status: DC
  Administered 2020-06-01 – 2020-06-05 (×10): 20 mg via ORAL
  Filled 2020-06-01 (×12): qty 1

## 2020-06-01 MED ORDER — SODIUM CHLORIDE 0.45 % IV SOLN: INTRAVENOUS | Status: DC

## 2020-06-01 MED ORDER — LOSARTAN POTASSIUM 50 MG PO TABS: 100.0000 mg | ORAL_TABLET | Freq: Every day | ORAL | Status: DC

## 2020-06-01 MED ORDER — TRAMADOL HCL 50 MG PO TABS
50.0000 mg | ORAL_TABLET | Freq: Three times a day (TID) | ORAL | Status: DC | PRN
  Filled 2020-06-01 (×2): qty 1

## 2020-06-01 MED ORDER — CITALOPRAM HYDROBROMIDE 20 MG PO TABS
10.0000 mg | ORAL_TABLET | Freq: Every day | ORAL | Status: DC
  Administered 2020-06-02 – 2020-06-05 (×4): 10 mg via ORAL
  Filled 2020-06-01 (×4): qty 1

## 2020-06-01 MED ORDER — SODIUM CHLORIDE 0.9% FLUSH: 3.0000 mL | Freq: Once | INTRAVENOUS | Status: DC

## 2020-06-01 MED ORDER — ACETAMINOPHEN 500 MG PO TABS
1000.0000 mg | ORAL_TABLET | Freq: Four times a day (QID) | ORAL | Status: DC | PRN
  Administered 2020-06-04 – 2020-06-05 (×3): 1000 mg via ORAL
  Filled 2020-06-01 (×3): qty 2

## 2020-06-01 MED ORDER — CYCLOBENZAPRINE HCL 5 MG PO TABS
5.0000 mg | ORAL_TABLET | Freq: Three times a day (TID) | ORAL | Status: DC | PRN
  Administered 2020-06-02 – 2020-06-05 (×3): 5 mg via ORAL
  Filled 2020-06-01 (×3): qty 1

## 2020-06-01 MED ORDER — METHYLPREDNISOLONE 2 MG PO TABS
2.0000 mg | ORAL_TABLET | Freq: Every day | ORAL | Status: DC
  Administered 2020-06-02 – 2020-06-05 (×4): 2 mg via ORAL
  Filled 2020-06-01 (×4): qty 1

## 2020-06-01 MED ORDER — CALCIUM CITRATE-VITAMIN D 315-200 MG-UNIT PO TABS: 1.0000 | ORAL_TABLET | Freq: Every day | ORAL | Status: DC

## 2020-06-01 MED ORDER — MORPHINE SULFATE (PF) 4 MG/ML IV SOLN
4.0000 mg | Freq: Once | INTRAVENOUS | Status: AC
  Administered 2020-06-01: 4 mg via INTRAVENOUS
  Filled 2020-06-01: qty 1

## 2020-06-01 MED ORDER — RED YEAST RICE 600 MG PO CAPS: ORAL_CAPSULE | Freq: Every day | ORAL | Status: DC

## 2020-06-01 MED ORDER — ESTRADIOL 0.1 MG/GM VA CREA: 2.0000 g | TOPICAL_CREAM | VAGINAL | Status: DC

## 2020-06-01 MED ORDER — TRAMADOL HCL 50 MG PO TABS
50.0000 mg | ORAL_TABLET | Freq: Four times a day (QID) | ORAL | Status: DC | PRN
  Administered 2020-06-02 – 2020-06-05 (×3): 50 mg via ORAL
  Filled 2020-06-01: qty 1

## 2020-06-01 MED ORDER — SODIUM CHLORIDE 0.9 % IV BOLUS
500.0000 mL | Freq: Once | INTRAVENOUS | Status: AC
  Administered 2020-06-01: 500 mL via INTRAVENOUS

## 2020-06-01 MED ORDER — GLUCOSAMINE-CHONDROITIN PO CAPS: ORAL_CAPSULE | Freq: Every day | ORAL | Status: DC

## 2020-06-01 MED ORDER — ACETAMINOPHEN-CODEINE #3 300-30 MG PO TABS
1.0000 | ORAL_TABLET | Freq: Three times a day (TID) | ORAL | Status: DC | PRN
  Administered 2020-06-02 – 2020-06-05 (×6): 1 via ORAL
  Filled 2020-06-01 (×7): qty 1

## 2020-06-01 MED ORDER — APIXABAN 2.5 MG PO TABS
2.5000 mg | ORAL_TABLET | Freq: Two times a day (BID) | ORAL | Status: DC
  Administered 2020-06-01 – 2020-06-05 (×8): 2.5 mg via ORAL
  Filled 2020-06-01 (×8): qty 1

## 2020-06-01 MED ORDER — ENOXAPARIN SODIUM 40 MG/0.4ML ~~LOC~~ SOLN: 40.0000 mg | SUBCUTANEOUS | Status: DC

## 2020-06-01 MED ORDER — HYDROCHLOROTHIAZIDE 25 MG PO TABS: 25.0000 mg | ORAL_TABLET | Freq: Every day | ORAL | Status: DC

## 2020-06-01 MED ORDER — CALCIUM CARBONATE-VITAMIN D 500-200 MG-UNIT PO TABS
1.0000 | ORAL_TABLET | Freq: Every day | ORAL | Status: DC
  Administered 2020-06-02: 1 via ORAL
  Filled 2020-06-01 (×4): qty 1

## 2020-06-01 MED ORDER — ONDANSETRON HCL 4 MG PO TABS
4.0000 mg | ORAL_TABLET | Freq: Four times a day (QID) | ORAL | Status: DC | PRN
  Administered 2020-06-05: 4 mg via ORAL
  Filled 2020-06-01: qty 1

## 2020-06-01 NOTE — ED Triage Notes (Signed)
Patient arrived by EMS from home. Patient has knee replacment Thursday. Patient had LOC at home and fell on floor. Patient's family found patient on the floor and called EMS.   Patient doesn't recall fall.   EMS stated knee surgeon wants reevaluation of knee to make sure no injury has occurred.

## 2020-06-01 NOTE — Consult Note (Signed)
Reason for Consult: Painful right knee after total knee replacement.  Patient had fell at home. Referring Physician: Hospitalist  JAYCEY GENS is an 79 y.o. female.  HPI: The patient had total knee replacement on Wednesday.  She was doing well postoperatively but woke up this morning on the floor and was uncertain of how she got there.  We had discussions with her and she said that she could move her leg and her toes were uncertain about what was going on with her.  We had EMS bring her to the emergency room.  She has been here for most of the day.  She has had x-rays which were normal.  She had CT scan which was normal.  We are unable to get her up with physical therapy because they were unavailable.  She will need social admission overnight and it sounds like medicine is kind enough to admit her for that.  Past Medical History:  Diagnosis Date  . Anxiety   . Hypertension   . SUI (stress urinary incontinence, female)   . Vaginal itching     Past Surgical History:  Procedure Laterality Date  . ABDOMINAL HYSTERECTOMY    . ABDOMINALPLASTY  2006  . CYSTOSCOPY WITH INJECTION  05/21/2012   Procedure: CYSTOSCOPY WITH INJECTION;  Surgeon: Kathi Ludwig, MD;  Location: [facility];  Service: Urology;  Laterality: N/A;  . HAMMERTOE RECONSTRUCTION WITH WEIL OSTEOTOMY Right 08/18/2018   Procedure: HAMMERTOE RECONSTRUCTION WITH WEIL OSTEOTOMY, PLANTAR PLATE RECONSTRUCTION;  Surgeon: Terance Hart, MD;  Location: [facility];  Service: Orthopedics;  Laterality: Right;  . HARDWARE REMOVAL Right 11/30/2018   Procedure: RIGHT FOOT HARDWARE REMOVAL;  Surgeon: Terance Hart, MD;  Location: [facility];  Service: Orthopedics;  Laterality: Right;  . LIPOSUCTION HEAD / NECK  JAN 2013  . REDUCTION MAMMAPLASTY Bilateral 1995  . VAGINAL HYSTERECTOMY/ ANTERIOR AND POSTERIOR REPAIR/ PUBOVAGINAL SLING  12-11-2003    No family history on  file.  Social History:  reports that she quit smoking about 37 years ago. Her smoking use included cigarettes. She has a 1.50 pack-year smoking history. She has never used smokeless tobacco. She reports current alcohol use. She reports that she does not use drugs.  Allergies:  Allergies  Allergen Reactions  . Epinephrine Other (See Comments)    SEVERE MIGRAINE  . Demerol [Meperidine] Rash  . Latex Rash    Medications: I have reviewed the patient's current medications.  Results for orders placed or performed during the hospital encounter of 06/01/20 (from the past 48 hour(s))  Basic metabolic panel     Status: Abnormal   Collection Time: 06/01/20  2:53 PM  Result Value Ref Range   Sodium 141 135 - 145 mmol/L   Potassium 4.0 3.5 - 5.1 mmol/L   Chloride 103 98 - 111 mmol/L   CO2 28 22 - 32 mmol/L   Glucose, Bld 108 (H) 70 - 99 mg/dL    Comment: Glucose reference range applies only to samples taken after fasting for at least 8 hours.   BUN 36 (H) 8 - 23 mg/dL   Creatinine, Ser 0.53 (H) 0.44 - 1.00 mg/dL   Calcium 8.8 (L) 8.9 - 10.3 mg/dL   GFR calc non Af Amer 42 (L) >60 mL/min   GFR calc Af Amer 48 (L) >60 mL/min   Anion gap 10 5 - 15    Comment: Performed at [facility], [address] [place], [place]  [address]  CBC     Status: Abnormal   Collection Time: 06/01/20  2:53 PM  Result Value Ref Range   WBC 13.0 (H) 4.0 - 10.5 K/uL   RBC 2.84 (L) 3.87 - 5.11 MIL/uL   Hemoglobin 9.5 (L) 12.0 - 15.0 g/dL   HCT 21.1 (L) 36 - 46 %   MCV 106.0 (H) 80.0 - 100.0 fL   MCH 33.5 26.0 - 34.0 pg   MCHC 31.6 30.0 - 36.0 g/dL   RDW 94.1 74.0 - 81.4 %   Platelets 216 150 - 400 K/uL   nRBC 0.2 0.0 - 0.2 %    Comment: Performed at [facility], [address] [place], [place] [address]  CK     Status: None   Collection Time: 06/01/20  2:53 PM  Result Value Ref Range   Total CK 117 38.0 - 234.0 U/L    Comment: Performed at [facility], [address] [place], [place] [address]    CT Head Wo Contrast  Result Date: 06/01/2020 CLINICAL DATA:  Head trauma, minor, normal mental status. EXAM: CT HEAD WITHOUT CONTRAST TECHNIQUE: Contiguous axial images were obtained from the base of the skull through the vertex without intravenous contrast. COMPARISON:  No pertinent prior studies available for comparison. FINDINGS: Brain: Motion degraded examination. Mild generalized parenchymal atrophy. Moderate patchy hypoattenuation within the cerebral white matter is nonspecific, but consistent with chronic small vessel ischemic disease. There is no acute intracranial hemorrhage. No demarcated cortical infarct. No extra-axial fluid collection. No evidence of intracranial mass. No midline shift. Vascular: No hyperdense vessel. Skull: Normal. Negative for fracture or focal lesion. Sinuses/Orbits: Visualized orbits show no acute finding. No significant paranasal sinus disease or mastoid effusion at the imaged levels. IMPRESSION: Motion degraded examination. No evidence of acute intracranial abnormality. Mild generalized parenchymal atrophy with moderate chronic small vessel ischemic disease. Electronically Signed   By: Jackey Loge DO   On: 06/01/2020 14:50   DG Knee Complete 4 Views Right  Result Date: 06/01/2020 CLINICAL DATA:  Right knee pain following a fall. Status post right knee replacement two days ago. EXAM: RIGHT KNEE - COMPLETE 4+ VIEW COMPARISON:  08/05/2012. FINDINGS: Interval right total knee prosthesis in satisfactory position and alignment. No fracture or dislocation. No effusion. IMPRESSION: No fracture or dislocation. Electronically Signed   By: Beckie Salts M.D.   On: 06/01/2020 14:57    ROS  ROS: I have reviewed the patient's review of systems thoroughly and there are no positive responses as relates to the HPI. Physical Exam Well-developed well-nourished patient in no acute distress. Alert and oriented x3 HEENT:within  normal limits Cardiac: Regular rate and rhythm Pulmonary: Lungs clear to auscultation Abdomen: Soft and nontender.  Normal active bowel sounds  Musculoskeletal: Right knee: Erythema around the knee.  Mild pain through range of motion.  No instability.  Moderate effusion.  Looks as I would expect a postop day 2 total knee to look.    Blood pressure 126/67, pulse 65, temperature 98.4 F (36.9 C), temperature source Oral, resp. rate 18, SpO2 97 %.  Assessment/Plan: 79 year old female underwent total knee replacement 2 days ago.  She unfortunately probably got dehydrated.  She had a fall at home that was unwitnessed and was uncertain what happened to her.  She had normal CAT scan.  X-rays of her knee are unremarkable.  At this point I think she needs hydration and she needs to get up with physical therapy.  I  think we will get her on CPM while she is in the hospital.  I think she needs a thigh-high Ted stocking as well to help control swelling.  She likely will be discharged tomorrow after physical therapy sees her.  Harvie Junior 06/01/2020, 5:57 PM

## 2020-06-01 NOTE — H&P (Signed)
History and Physical   Kayla Logan INO:[id number] DOB: [birth year] DOA: 06/01/2020  Referring MD/NP/PA: Dr. Linwood Dibbles  PCP: Johny Blamer, MD   Outpatient Specialists: Dr. Luiz Blare  Patient coming from: Home  Chief Complaint: Fall  HPI: Kayla Logan is a 79 y.o. female with medical history significant of osteoarthritis status post right total knee replacement 2 days ago that was brought from home secondary to fall.  Patient apparently was doing fine until yesterday.  She went to bed and got up and found herself on the floor.  She has no recollection of what happened.  She had problem trying to get up.  She had another episode again this morning.  Denies any dizziness.  Denied any awareness of passing out but has been unable to walk or put weight on her leg.  She was seen in the ER including by orthopedics and evaluated.  Patient has problem and difficulty with mobility.  Plan was to get patient evaluated by PT OT.  Orthopedics has asked for consult and admission to the medical service for patient's evaluation.  She denied any chest pain no shortness of breath and denied any palpitations.  No other complaints.  Patient is back to baseline except for pain and is fully awake and alert..  ED Course: Temperature 98.4 blood pressure 126/67 pulse 76 respirate 18 oxygen sat 95% room air.  White count is 13.1 hemoglobin 9.5 and platelets 216.  Chemistry largely within normal.  X-ray of the right knee and head CT without contrast were both within normal.  Patient evaluated by Ortho and being admitted for observation and further treatment.  Review of Systems: As per HPI otherwise 10 point review of systems negative.    Past Medical History:  Diagnosis Date  . Anxiety   . Hypertension   . SUI (stress urinary incontinence, female)   . Vaginal itching     Past Surgical History:  Procedure Laterality Date  . ABDOMINAL HYSTERECTOMY    . ABDOMINALPLASTY  2006  . CYSTOSCOPY WITH INJECTION   05/21/2012   Procedure: CYSTOSCOPY WITH INJECTION;  Surgeon: Kathi Ludwig, MD;  Location: [facility];  Service: Urology;  Laterality: N/A;  . HAMMERTOE RECONSTRUCTION WITH WEIL OSTEOTOMY Right 08/18/2018   Procedure: HAMMERTOE RECONSTRUCTION WITH WEIL OSTEOTOMY, PLANTAR PLATE RECONSTRUCTION;  Surgeon: Terance Hart, MD;  Location: [facility];  Service: Orthopedics;  Laterality: Right;  . HARDWARE REMOVAL Right 11/30/2018   Procedure: RIGHT FOOT HARDWARE REMOVAL;  Surgeon: Terance Hart, MD;  Location: [facility];  Service: Orthopedics;  Laterality: Right;  . LIPOSUCTION HEAD / NECK  JAN 2013  . REDUCTION MAMMAPLASTY Bilateral 1995  . VAGINAL HYSTERECTOMY/ ANTERIOR AND POSTERIOR REPAIR/ PUBOVAGINAL SLING  12-11-2003     reports that she quit smoking about 37 years ago. Her smoking use included cigarettes. She has a 1.50 pack-year smoking history. She has never used smokeless tobacco. She reports current alcohol use. She reports that she does not use drugs.  Allergies  Allergen Reactions  . Epinephrine Other (See Comments)    SEVERE MIGRAINE  . Demerol [Meperidine] Rash  . Latex Rash    No family history on file.   Prior to Admission medications   Medication Sig Start Date End Date Taking? Authorizing Provider  acetaminophen (TYLENOL) 500 MG tablet Take 1,000 mg by mouth in the morning and at bedtime.    Yes [provider]  acetaminophen-codeine (TYLENOL #3) 300-30 MG tablet Take 1 tablet  by mouth 3 (three) times daily as needed for moderate pain or severe pain.  02/01/20  Yes [provider]  Calcium Carbonate-Vitamin D (CALCIUM + D PO) Take 1 tablet by mouth daily.    Yes [provider]  citalopram (CELEXA) 10 MG tablet Take 10 mg by mouth daily.   Yes [provider]  cyclobenzaprine (FLEXERIL) 5 MG tablet Take 5 mg by mouth 3 (three) times daily as needed for muscle spasms.  05/03/20   Yes [provider]  estradiol (ESTRACE) 0.1 MG/GM vaginal cream Place 2 g vaginally 2 (two) times a week. Mondays and fridays   Yes [provider]  GLUCOSAMINE-CHONDROITIN PO Take 2 tablets by mouth daily.    Yes [provider]  hydrochlorothiazide (HYDRODIURIL) 25 MG tablet Take 25 mg by mouth daily. 02/23/20  Yes [provider]  losartan (COZAAR) 100 MG tablet Take 100 mg by mouth daily.   Yes [provider]  methylPREDNISolone (MEDROL) 4 MG tablet Take 2 mg by mouth daily.  04/11/20  Yes [provider]  propranolol (INDERAL) 20 MG tablet Take 20 mg by mouth 3 (three) times daily.   Yes [provider]  Red Yeast Rice Extract (RED YEAST RICE PO) Take 2 tablets by mouth daily.    Yes [provider]  traMADol (ULTRAM) 50 MG tablet Take 50 mg by mouth every 6 (six) hours as needed for moderate pain or severe pain.    Yes [provider]  COLACE 100 MG capsule Take 100 mg by mouth daily as needed. Patient not taking: Reported on 06/01/2020 05/10/20   [provider]  conjugated estrogens (PREMARIN) vaginal cream Place vaginally. Patient not taking: Reported on 06/01/2020 08/18/15   [provider]  ELIQUIS 2.5 MG TABS tablet Take 2.5 mg by mouth 2 (two) times daily. 05/30/20   [provider]  losartan-hydrochlorothiazide (HYZAAR) 100-25 MG per tablet Take 1 tablet by mouth daily. Patient not taking: Reported on 06/01/2020    [provider]  methylPREDNISolone (MEDROL) 2 MG tablet Take 2 mg by mouth daily. Patient not taking: Reported on 06/01/2020    [provider]  PERCOCET 5-325 MG tablet Take 1 tablet by mouth every 8 (eight) hours as needed. Patient not taking: Reported on 06/01/2020 05/10/20   [provider]  tiZANidine (ZANAFLEX) 2 MG tablet Take 2 mg by mouth every 8 (eight) hours as needed. Patient not taking: Reported on 06/01/2020 05/10/20   [provider]    Physical Exam: Vitals:   06/01/20 1250 06/01/20 2032 06/01/20 2055 06/01/20 2114  BP: 126/67 (!) 133/73 (!) 149/97   Pulse: 65 76 73   Resp: 18 16 16    Temp: 98.4 F (36.9 C)  98.2 F (36.8 C)   TempSrc: Oral  Oral   SpO2: 97% 95% 98%   Weight:    89.7 kg  Height:    5' 5.5" (1.664 m)      Constitutional: Weak, debilitated, in mild distress Vitals:   06/01/20 1250 06/01/20 2032 06/01/20 2055 06/01/20 2114  BP: 126/67 (!) 133/73 (!) 149/97   Pulse: 65 76 73   Resp: 18 16 16    Temp: 98.4 F (36.9 C)  98.2 F (36.8 C)   TempSrc: Oral  Oral   SpO2: 97% 95% 98%   Weight:    89.7 kg  Height:    5' 5.5" (1.664 m)   Eyes: PERRL, lids and conjunctivae normal ENMT: Mucous membranes are  moist. Posterior pharynx clear of any exudate or lesions.Normal dentition.  Neck: normal, supple, no masses, no thyromegaly Respiratory: clear to auscultation bilaterally, no wheezing, no crackles. Normal respiratory effort. No accessory muscle use.  Cardiovascular: Regular rate and rhythm, no murmurs / rubs / gallops. No extremity edema. 2+ pedal pulses. No carotid bruits.  Abdomen: no tenderness, no masses palpated. No hepatosplenomegaly. Bowel sounds positive.  Musculoskeletal: no clubbing / cyanosis. No joint deformity upper and lower extremities. Good ROM, no contractures. Normal muscle tone.  Right knee red swollen tender no evidence of abnormality Skin: no rashes, lesions, ulcers. No induration Neurologic: CN 2-12 grossly intact. Sensation intact, DTR normal. Strength 5/5 in all 4.  Psychiatric: Normal judgment and insight. Alert and oriented x 3. Normal mood.     Labs on Admission: I have personally reviewed following labs and imaging studies  CBC: Recent Labs  Lab 06/01/20 1453  WBC 13.0*  HGB 9.5*  HCT 30.1*  MCV 106.0*  PLT 216   Basic Metabolic Panel: Recent Labs  Lab 06/01/20 1453  NA 141  K 4.0  CL 103  CO2 28  GLUCOSE 108*  BUN 36*  CREATININE  1.24*  CALCIUM 8.8*   GFR: Estimated Creatinine Clearance: 41.8 mL/min (A) (by C-G formula based on SCr of 1.24 mg/dL (H)). Liver Function Tests: No results for input(s): AST, ALT, ALKPHOS, BILITOT, PROT, ALBUMIN in the last 168 hours. No results for input(s): LIPASE, AMYLASE in the last 168 hours. No results for input(s): AMMONIA in the last 168 hours. Coagulation Profile: No results for input(s): INR, PROTIME in the last 168 hours. Cardiac Enzymes: Recent Labs  Lab 06/01/20 1453  CKTOTAL 117   BNP (last 3 results) No results for input(s): PROBNP in the last 8760 hours. HbA1C: No results for input(s): HGBA1C in the last 72 hours. CBG: No results for input(s): GLUCAP in the last 168 hours. Lipid Profile: No results for input(s): CHOL, HDL, LDLCALC, TRIG, CHOLHDL, LDLDIRECT in the last 72 hours. Thyroid Function Tests: No results for input(s): TSH, T4TOTAL, FREET4, T3FREE, THYROIDAB in the last 72 hours. Anemia Panel: No results for input(s): VITAMINB12, FOLATE, FERRITIN, TIBC, IRON, RETICCTPCT in the last 72 hours. Urine analysis: No results found for: COLORURINE, APPEARANCEUR, LABSPEC, PHURINE, GLUCOSEU, HGBUR, BILIRUBINUR, KETONESUR, PROTEINUR, UROBILINOGEN, NITRITE, LEUKOCYTESUR Sepsis Labs: @LABRCNTIP (procalcitonin:4,lacticidven:4) )No results found for this or any previous visit (from the past 240 hour(s)).   Radiological Exams on Admission: CT Head Wo Contrast  Result Date: 06/01/2020 CLINICAL DATA:  Head trauma, minor, normal mental status. EXAM: CT HEAD WITHOUT CONTRAST TECHNIQUE: Contiguous axial images were obtained from the base of the skull through the vertex without intravenous contrast. COMPARISON:  No pertinent prior studies available for comparison. FINDINGS: Brain: Motion degraded examination. Mild generalized parenchymal atrophy. Moderate patchy hypoattenuation within the cerebral white matter is nonspecific, but consistent with chronic small vessel ischemic  disease. There is no acute intracranial hemorrhage. No demarcated cortical infarct. No extra-axial fluid collection. No evidence of intracranial mass. No midline shift. Vascular: No hyperdense vessel. Skull: Normal. Negative for fracture or focal lesion. Sinuses/Orbits: Visualized orbits show no acute finding. No significant paranasal sinus disease or mastoid effusion at the imaged levels. IMPRESSION: Motion degraded examination. No evidence of acute intracranial abnormality. Mild generalized parenchymal atrophy with moderate chronic small vessel ischemic disease. Electronically Signed   By: 06/03/2020 DO   On: 06/01/2020 14:50   DG Knee Complete 4 Views Right  Result Date: 06/01/2020 CLINICAL DATA:  Right knee  pain following a fall. Status post right knee replacement two days ago. EXAM: RIGHT KNEE - COMPLETE 4+ VIEW COMPARISON:  08/05/2012. FINDINGS: Interval right total knee prosthesis in satisfactory position and alignment. No fracture or dislocation. No effusion. IMPRESSION: No fracture or dislocation. Electronically Signed   By: Beckie Salts M.D.   On: 06/01/2020 14:57     Assessment/Plan Principal Problem:   Fall Active Problems:   Benign essential HTN   Osteoarthrosis, localized, primary, knee, right     #1  Status post fall: Not sure of the exact cause.  Could be secondary to mechanical fall or even syncope but patient denies.  Normal history of cardiac disease.  Patient has evidence of dehydration and mild acute kidney injury.  We will admit the patient and hydrate the patient.  Evaluation by PT and OT in the morning.  #2  Status post right total knee replacement: Appreciate orthopedics input.  Orthopedics to follow on further treatment to follow.  #3 essential hypertension, continue blood pressure medications and monitor  #4 anxiety disorder: Continue with home regimen   DVT prophylaxis: Apixaban Code Status: Full code Family Communication: No family at bedside Disposition  Plan: Home Consults called: Dr. Jodi Geralds, orthopedic surgery Admission status: Observation  Severity of Illness: The appropriate patient status for this patient is OBSERVATION. Observation status is judged to be reasonable and necessary in order to provide the required intensity of service to ensure the patient's safety. The patient's presenting symptoms, physical exam findings, and initial radiographic and laboratory data in the context of their medical condition is felt to place them at decreased risk for further clinical deterioration. Furthermore, it is anticipated that the patient will be medically stable for discharge from the hospital within 2 midnights of admission. The following factors support the patient status of observation.   " The patient's presenting symptoms include fall. " The physical exam findings include right knee changes. " The initial radiographic and laboratory data are no obvious distress.     Lonia Blood MD [facility] Pager [phone]  If 7PM-7AM, please contact night-coverage [email or web address] Password [email or web address]  06/01/2020, 11:13 PM

## 2020-06-01 NOTE — ED Provider Notes (Signed)
[facility]-EMERGENCY DEPT Provider Note   CSN: [id number] Arrival date & time: 06/01/20  1232     History Chief Complaint  Patient presents with   Kayla Logan is a 79 y.o. female.  The history is provided by the patient and medical records. No language interpreter was used.  Fall     79 year old female brought here via EMS from home for evaluation of a suspected fall.  Patient reports she had a right total knee replacement done by Dr. Luiz Logan approximately 3 days ago.  Last night she went to bed and this morning she woke up laying on the ground next to her bed.  She denies recall falling but she does complain of pain to her right knee and was having trouble getting up from the ground due to the pain.  Family called EMS.  At this time she complaining of sharp throbbing moderate intensity pain primarily to her right leg including her right knee.  She does not complain of any associated headache, neck pain, headache lightheadedness, dizziness, chest pain, shortness of breath, abdominal pain, back pain, focal numbness or focal weakness.  She does endorse feeling cold and requesting for blanket.  She denies taking more pain medication than prescribed.  She states she felt fine last night.  Patient denies history of seizures.  States that she may have had a seizure in her 48s.  No report of tongue biting, bowel bladder incontinence   Past Medical History:  Diagnosis Date   Anxiety    Hypertension    SUI (stress urinary incontinence, female)    Vaginal itching     There are no problems to display for this patient.   Past Surgical History:  Procedure Laterality Date   ABDOMINAL HYSTERECTOMY     ABDOMINALPLASTY  2006   CYSTOSCOPY WITH INJECTION  05/21/2012   Procedure: CYSTOSCOPY WITH INJECTION;  Surgeon: Kathi Ludwig, MD;  Location: [facility];  Service: Urology;  Laterality: N/A;   HAMMERTOE RECONSTRUCTION WITH WEIL  OSTEOTOMY Right 08/18/2018   Procedure: HAMMERTOE RECONSTRUCTION WITH WEIL OSTEOTOMY, PLANTAR PLATE RECONSTRUCTION;  Surgeon: Terance Hart, MD;  Location: [facility];  Service: Orthopedics;  Laterality: Right;   HARDWARE REMOVAL Right 11/30/2018   Procedure: RIGHT FOOT HARDWARE REMOVAL;  Surgeon: Terance Hart, MD;  Location: [facility];  Service: Orthopedics;  Laterality: Right;   LIPOSUCTION HEAD / NECK  JAN 2013   REDUCTION MAMMAPLASTY Bilateral 1995   VAGINAL HYSTERECTOMY/ ANTERIOR AND POSTERIOR REPAIR/ PUBOVAGINAL SLING  12-11-2003     OB History   No obstetric history on file.     No family history on file.  Social History   Tobacco Use   Smoking status: Former Smoker    Packs/day: 0.10    Years: 15.00    Pack years: 1.50    Types: Cigarettes    Quit date: 05/19/1983    Years since quitting: 37.0   Smokeless tobacco: Never Used  Substance Use Topics   Alcohol use: Yes    Comment: occasional   Drug use: No    Home Medications Prior to Admission medications   Medication Sig Start Date End Date Taking? Authorizing Provider  acetaminophen (TYLENOL) 500 MG tablet Take 1,000 mg by mouth every morning.     [provider]  Calcium Carbonate-Vitamin D (CALCIUM + D PO) Take 1 tablet by mouth daily.     [provider]  citalopram (CELEXA)  10 MG tablet Take 10 mg by mouth daily.    [provider]  estradiol (ESTRACE) 0.1 MG/GM vaginal cream Place 2 g vaginally 2 (two) times a week. Mondays and fridays    [provider]  GLUCOSAMINE-CHONDROITIN PO Take 2 tablets by mouth daily.     [provider]  losartan-hydrochlorothiazide (HYZAAR) 100-25 MG per tablet Take 1 tablet by mouth daily.    [provider]  methylPREDNISolone (MEDROL) 2 MG tablet Take 2 mg by mouth daily.    [provider]  propranolol (INDERAL) 20 MG tablet Take 20 mg by mouth 3 (three) times daily.     [provider]  Red Yeast Rice Extract (RED YEAST RICE PO) Take 2 tablets by mouth daily.     [provider]  traMADol (ULTRAM) 50 MG tablet Take 50 mg by mouth daily.    [provider]    Allergies    Epinephrine, Demerol [meperidine], and Latex  Review of Systems   Review of Systems  All other systems reviewed and are negative.   Physical Exam Updated Vital Signs BP 126/67 (BP Location: Left Arm)    Pulse 65    Temp 98.4 F (36.9 C) (Oral)    Resp 18    SpO2 97%   Physical Exam Vitals and nursing note reviewed.  Constitutional:      General: She is not in acute distress.    Appearance: She is well-developed.  HENT:     Head: Atraumatic.     Comments: No scalp tenderness, no midface tenderness, no hematoma to pattern, septal hematoma, malocclusion. Eyes:     Extraocular Movements: Extraocular movements intact.     Conjunctiva/sclera: Conjunctivae normal.     Pupils: Pupils are equal, round, and reactive to light.  Cardiovascular:     Rate and Rhythm: Normal rate and regular rhythm.     Pulses: Normal pulses.     Heart sounds: Normal heart sounds.  Pulmonary:     Effort: Pulmonary effort is normal.     Breath sounds: Normal breath sounds. No wheezing or rhonchi.  Abdominal:     Palpations: Abdomen is soft.     Comments: Protuberant abdomen nontender to palpation.  Musculoskeletal:        General: Tenderness (Right lower extremity: Edematous right lower extremity with diffuse ecchymosis noted to the anterior knee with dressing in place.  Wound of normal appearance however the leg is warm and erythematous.  Dorsalis pedis pulse palpable.) present.     Cervical back: Normal range of motion and neck supple. No rigidity or tenderness.     Comments: No significant midline spine tenderness.  No tenderness to bilateral hip.  Left lower extremity nontender.  Skin:    Findings: No rash.  Neurological:     Mental Status: She is alert and oriented to  person, place, and time.     GCS: GCS eye subscore is 4. GCS verbal subscore is 5. GCS motor subscore is 6.     ED Results / Procedures / Treatments   Labs (all labs ordered are listed, but only abnormal results are displayed) Labs Reviewed  BASIC METABOLIC PANEL - Abnormal; Notable for the following components:      Result Value   Glucose, Bld 108 (*)    BUN 36 (*)    Creatinine, Ser 1.24 (*)    Calcium 8.8 (*)    GFR calc non Af Amer 42 (*)    GFR calc Af Kayla Logan  48 (*)    All other components within normal limits  CBC - Abnormal; Notable for the following components:   WBC 13.0 (*)    RBC 2.84 (*)    Hemoglobin 9.5 (*)    HCT 30.1 (*)    MCV 106.0 (*)    All other components within normal limits  SARS CORONAVIRUS 2 BY RT PCR (HOSPITAL ORDER, PERFORMED IN [facility])  CK  URINALYSIS, ROUTINE W REFLEX MICROSCOPIC    EKG None  Radiology CT Head Wo Contrast  Result Date: 06/01/2020 CLINICAL DATA:  Head trauma, minor, normal mental status. EXAM: CT HEAD WITHOUT CONTRAST TECHNIQUE: Contiguous axial images were obtained from the base of the skull through the vertex without intravenous contrast. COMPARISON:  No pertinent prior studies available for comparison. FINDINGS: Brain: Motion degraded examination. Mild generalized parenchymal atrophy. Moderate patchy hypoattenuation within the cerebral white matter is nonspecific, but consistent with chronic small vessel ischemic disease. There is no acute intracranial hemorrhage. No demarcated cortical infarct. No extra-axial fluid collection. No evidence of intracranial mass. No midline shift. Vascular: No hyperdense vessel. Skull: Normal. Negative for fracture or focal lesion. Sinuses/Orbits: Visualized orbits show no acute finding. No significant paranasal sinus disease or mastoid effusion at the imaged levels. IMPRESSION: Motion degraded examination. No evidence of acute intracranial abnormality. Mild generalized parenchymal  atrophy with moderate chronic small vessel ischemic disease. Electronically Signed   By: Kayla Logan   On: 06/01/2020 14:50   DG Knee Complete 4 Views Right  Result Date: 06/01/2020 CLINICAL DATA:  Right knee pain following a fall. Status post right knee replacement two days ago. EXAM: RIGHT KNEE - COMPLETE 4+ VIEW COMPARISON:  08/05/2012. FINDINGS: Interval right total knee prosthesis in satisfactory position and alignment. No fracture or dislocation. No effusion. IMPRESSION: No fracture or dislocation. Electronically Signed   By: Kayla SaltsSteven  Reid M.D.   On: 06/01/2020 14:57    Procedures Procedures (including critical care time)  Medications Ordered in ED Medications  sodium chloride flush (NS) 0.9 % injection 3 mL (has no administration in time range)  morphine 4 MG/ML injection 4 mg (4 mg Intravenous Given 06/01/20 1646)  sodium chloride 0.9 % bolus 500 mL (500 mLs Intravenous New Bag/Given 06/01/20 1654)    ED Course  I have reviewed the triage vital signs and the nursing notes.  Pertinent labs & imaging results that were available during my care of the patient were reviewed by me and considered in my medical decision making (see chart for details).    MDM Rules/Calculators/A&P                          BP 126/67 (BP Location: Left Arm)    Pulse 65    Temp 98.4 F (36.9 C) (Oral)    Resp 18    SpO2 97%   Final Clinical Impression(s) / ED Diagnoses Final diagnoses:  Fall in home, initial encounter  Postoperative stiffness of total knee replacement, initial encounter (HCC)    Rx / DC Orders ED Discharge Orders    None     2:13 PM Patient was found laying next to her bed on the ground this morning but did not recall falling.  She does complain of right leg pain and reportedly had a right total knee replacement 3 days ago by Dr. Luiz Logan.  She does endorse pain throughout her right lower extremity.  Her right leg is swollen with finding consistent with  postsurgical changes.  Will  obtain x-ray of her right knee as it is tender to palpation.  Will obtain labs, obtain head CT scan.  I discussed care with Dr. Lynelle Logan.  4:16 PM X-ray of her right knee without any concerning feature.  Head CT scan showed no acute injury and no intracranial hemorrhage.  I have an opportunity to speak with orthopedist Dr. Luiz Logan over the phone.  At this time suspicion for DVT is less likely as she is only postop day 2.  He recommends IV fluid, and get physical therapy to be involved prior to patient discharge.  Patient's lab does show a mild AKI with a BUN of 36, creatinine of 1.24.  Elevated white count of 13.0 likely related to postop reaction. Normal total CK, doubt rhabdo.   5:03 PM Unfortunately attempted to contact PT without success.  WIll have pt ambulate.  5:54 PM Pt unable to bend knee and cannot ambulate.  She has not ambulate since her surgery.  Dr. Luiz Logan has seen and evaluate pt and request medicine admission.  He will see pt tomorrow morning and anticipate d/c tomorrow.  He has low suspicion for infection or DVT. He recommend TED hose and also to keep leg in CPM (continuous passive machine) to help with flex/extend knee while resting.  Screening covid-19 test ordered.   Kayla Logan was evaluated in Emergency Department on 06/01/2020 for the symptoms described in the history of present illness. She was evaluated in the context of the global COVID-19 pandemic, which necessitated consideration that the patient might be at risk for infection with the SARS-CoV-2 virus that causes COVID-19. Institutional protocols and algorithms that pertain to the evaluation of patients at risk for COVID-19 are in a state of rapid change based on information released by regulatory bodies including the CDC and federal and state organizations. These policies and algorithms were followed during the patient's care in the ED.  6:34 PM Appreciate consultation from [facility] Kayla Logan who agrees to see and  admit pt for further management of her possible syncope or inability to ambulate, likely will benefit PT/OT and placement.    Kayla Helper, PA-C 06/01/20 1836    Linwood Dibbles, MD 06/02/20 [phone]

## 2020-06-01 NOTE — ED Provider Notes (Signed)
Medical screening examination/treatment/procedure(s) were conducted as a shared visit with non-physician practitioner(s) and myself.  I personally evaluated the patient during the encounter.   EKG Normal sinus rhythm rate 74 Normal intervals Normal ST-T waves No prior EKG for comparison  Final diagnoses:  Fall in home, initial encounter  Postoperative stiffness of total knee replacement, initial encounter [facility])      Linwood Dibbles, MD 06/02/20 1501

## 2020-06-02 ENCOUNTER — Encounter (HOSPITAL_COMMUNITY): Payer: Self-pay | Admitting: Internal Medicine

## 2020-06-02 ENCOUNTER — Observation Stay (HOSPITAL_BASED_OUTPATIENT_CLINIC_OR_DEPARTMENT_OTHER): Payer: Medicare Other

## 2020-06-02 ENCOUNTER — Other Ambulatory Visit: Payer: Self-pay

## 2020-06-02 DIAGNOSIS — Z96659 Presence of unspecified artificial knee joint: Secondary | ICD-10-CM

## 2020-06-02 DIAGNOSIS — W19XXXD Unspecified fall, subsequent encounter: Secondary | ICD-10-CM

## 2020-06-02 DIAGNOSIS — M1711 Unilateral primary osteoarthritis, right knee: Secondary | ICD-10-CM | POA: Diagnosis not present

## 2020-06-02 DIAGNOSIS — I10 Essential (primary) hypertension: Secondary | ICD-10-CM | POA: Diagnosis not present

## 2020-06-02 DIAGNOSIS — I361 Nonrheumatic tricuspid (valve) insufficiency: Secondary | ICD-10-CM | POA: Diagnosis not present

## 2020-06-02 DIAGNOSIS — T8489XA Other specified complication of internal orthopedic prosthetic devices, implants and grafts, initial encounter: Secondary | ICD-10-CM | POA: Diagnosis not present

## 2020-06-02 DIAGNOSIS — M25669 Stiffness of unspecified knee, not elsewhere classified: Secondary | ICD-10-CM

## 2020-06-02 LAB — COMPREHENSIVE METABOLIC PANEL
ALT: 12 U/L (ref 0–44)
AST: 16 U/L (ref 15–41)
Albumin: 3.4 g/dL — ABNORMAL LOW (ref 3.5–5.0)
Alkaline Phosphatase: 37 U/L — ABNORMAL LOW (ref 38–126)
Anion gap: 9 (ref 5–15)
BUN: 27 mg/dL — ABNORMAL HIGH (ref 8–23)
CO2: 28 mmol/L (ref 22–32)
Calcium: 8.6 mg/dL — ABNORMAL LOW (ref 8.9–10.3)
Chloride: 104 mmol/L (ref 98–111)
Creatinine, Ser: 1.11 mg/dL — ABNORMAL HIGH (ref 0.44–1.00)
GFR calc Af Amer: 55 mL/min — ABNORMAL LOW (ref >60)
GFR calc non Af Amer: 48 mL/min — ABNORMAL LOW (ref >60)
Glucose, Bld: 112 mg/dL — ABNORMAL HIGH (ref 70–99)
Potassium: 3.8 mmol/L (ref 3.5–5.1)
Sodium: 141 mmol/L (ref 135–145)
Total Bilirubin: 0.7 mg/dL (ref 0.3–1.2)
Total Protein: 6.6 g/dL (ref 6.5–8.1)

## 2020-06-02 LAB — CBC
HCT: 28.8 % — ABNORMAL LOW (ref 36.0–46.0)
Hemoglobin: 8.9 g/dL — ABNORMAL LOW (ref 12.0–15.0)
MCH: 32.8 pg (ref 26.0–34.0)
MCHC: 30.9 g/dL (ref 30.0–36.0)
MCV: 106.3 fL — ABNORMAL HIGH (ref 80.0–100.0)
Platelets: 201 10*3/uL (ref 150–400)
RBC: 2.71 MIL/uL — ABNORMAL LOW (ref 3.87–5.11)
RDW: 14.5 % (ref 11.5–15.5)
WBC: 10 10*3/uL (ref 4.0–10.5)
nRBC: 0.2 % (ref 0.0–0.2)

## 2020-06-02 LAB — ECHOCARDIOGRAM COMPLETE
Area-P 1/2: 3.54 cm2
Calc EF: 69.5 %
Height: 65.5 in
S' Lateral: 3.3 cm
Single Plane A2C EF: 70.9 %
Single Plane A4C EF: 70.8 %
Weight: 3164.04 oz

## 2020-06-02 LAB — SARS CORONAVIRUS 2 BY RT PCR (HOSPITAL ORDER, PERFORMED IN ~~LOC~~ HOSPITAL LAB): SARS Coronavirus 2: NEGATIVE

## 2020-06-02 LAB — URINALYSIS, ROUTINE W REFLEX MICROSCOPIC
Bilirubin Urine: NEGATIVE
Glucose, UA: NEGATIVE mg/dL
Hgb urine dipstick: NEGATIVE
Ketones, ur: 5 mg/dL — AB
Leukocytes,Ua: NEGATIVE
Nitrite: NEGATIVE
Protein, ur: NEGATIVE mg/dL
Specific Gravity, Urine: 1.014 (ref 1.005–1.030)
pH: 5 (ref 5.0–8.0)

## 2020-06-02 MED ORDER — SODIUM CHLORIDE 0.45 % IV SOLN: INTRAVENOUS | Status: AC

## 2020-06-02 NOTE — Progress Notes (Signed)
  Echocardiogram 2D Echocardiogram has been performed.  Kayla Logan F 06/02/2020, 12:42 PM

## 2020-06-02 NOTE — Progress Notes (Signed)
Physical Therapy Treatment Patient Details Name: Kayla Logan MRN: [id number] DOB: [birth year] Today's Date: 06/02/2020    History of Present Illness Pt s/p R TKR 05/31/20 as day surgery and fell 06/01/20.  No fx per imaging but significant bruising.      PT Comments    Pt continues ltd by pain and fatigue but with noted improvement in activity tolerance and R LE WB noted this pm with KI in place.   Follow Up Recommendations  Home health PT     Equipment Recommendations  3in1 (PT)    Recommendations for Other Services       Precautions / Restrictions Precautions Precautions: Fall;Knee Restrictions Weight Bearing Restrictions: No Other Position/Activity Restrictions: WBAT    Mobility  Bed Mobility Overal bed mobility: Needs Assistance Bed Mobility: Supine to Sit     Supine to sit: Min assist Sit to supine: Mod assist   General bed mobility comments: increased time with cues for sequence and use of L LE to self assist;  Physical assist for R LE management and to control trunk  Transfers Overall transfer level: Needs assistance Equipment used: Rolling walker (2 wheeled) Transfers: Sit to/from Stand Sit to Stand: Min assist;Mod assist         General transfer comment: cues for LE management and use of UEs to self assist  Ambulation/Gait Ambulation/Gait assistance: Min assist;+2 physical assistance;+2 safety/equipment Gait Distance (Feet): 12 Feet Assistive device: Rolling walker (2 wheeled) Gait Pattern/deviations: Step-to pattern;Decreased step length - right;Decreased step length - left;Shuffle;Trunk flexed Gait velocity: decr   General Gait Details: Cues for sequence, posture, position from RW and increased UE WB.  Pt limited by fatigue but marked improvement over earlier session   Stairs             Wheelchair Mobility    Modified Rankin (Stroke Patients Only)       Balance Overall balance assessment: Needs assistance Sitting-balance  support: No upper extremity supported;Feet supported Sitting balance-Leahy Scale: Good     Standing balance support: Bilateral upper extremity supported Standing balance-Leahy Scale: Poor                              Cognition Arousal/Alertness: Awake/alert Behavior During Therapy: WFL for tasks assessed/performed Overall Cognitive Status: Within Functional Limits for tasks assessed                                        Exercises Total Joint Exercises Ankle Circles/Pumps: AROM;Both;15 reps;Supine Quad Sets: AROM;Both;10 reps;Supine Heel Slides: AAROM;Right;10 reps;Supine Straight Leg Raises: AAROM;Right;10 reps;Supine    General Comments        Pertinent Vitals/Pain Pain Assessment: 0-10 Pain Score: 5  Pain Location: R knee Pain Descriptors / Indicators: Aching;Grimacing;Guarding;Sore Pain Intervention(s): Limited activity within patient's tolerance;Monitored during session;Premedicated before session;Ice applied    Home Living Family/patient expects to be discharged to:: Private residence Living Arrangements: Spouse/significant other Available Help at Discharge: Family Type of Home: House Home Access: Stairs to enter Entrance Stairs-Rails: Right Home Layout: One level Home Equipment: Environmental consultant - 2 wheels;Cane - single point      Prior Function Level of Independence: Independent      Comments: IND prior to this TKR   PT Goals (current goals can now be found in the care plan section) Acute Rehab PT Goals Patient Stated Goal: Regain IND  PT Goal Formulation: With patient Time For Goal Achievement: 06/16/20 Potential to Achieve Goals: Fair Progress towards PT goals: Progressing toward goals    Frequency    7X/week      PT Plan Current plan remains appropriate    Co-evaluation              AM-PAC PT "6 Clicks" Mobility   Outcome Measure  Help needed turning from your back to your side while in a flat bed without using  bedrails?: A Little Help needed moving from lying on your back to sitting on the side of a flat bed without using bedrails?: A Lot Help needed moving to and from a bed to a chair (including a wheelchair)?: A Lot Help needed standing up from a chair using your arms (e.g., wheelchair or bedside chair)?: A Lot Help needed to walk in hospital room?: A Little Help needed climbing 3-5 steps with a railing? : Total 6 Click Score: 13    End of Session Equipment Utilized During Treatment: Gait belt;Right knee immobilizer Activity Tolerance: Patient limited by pain;Patient limited by fatigue Patient left: in chair;with call bell/phone within reach;with chair alarm set Nurse Communication: Mobility status PT Visit Diagnosis: Difficulty in walking, not elsewhere classified (R26.2);Pain Pain - Right/Left: Right Pain - part of body: Knee     Time: 9892-1194 PT Time Calculation (min) (ACUTE ONLY): 22 min  Charges:  $Gait Training: 8-22 mins $Therapeutic Exercise: 8-22 mins $Therapeutic Activity: 8-22 mins                     Mauro Kaufmann PT Acute Rehabilitation Services Pager [phone] Office [phone]    Kayla Logan 06/02/2020, 3:46 PM

## 2020-06-02 NOTE — Progress Notes (Signed)
Patient has received a welcome kit. Handbook reviewed with the patient.

## 2020-06-02 NOTE — Progress Notes (Addendum)
PROGRESS NOTE    Kayla Logan  GUY:[id number] DOB: [birth year] DOA: 06/01/2020 PCP: Johny Blamer, MD  Brief Narrative:Kayla Logan is a 79 y.o. female with medical history of hypertension, obesity, osteoarthritis underwent right total knee replacement 2 days prior, reports that postop day 1 she was unable to get out of bed, hence does not remember eating or drinking very much, the following morning she woke up tried to stand up and next thing she remembers is waking up on the floor. -She denies any chest pain, shortness of breath, palpitations or dizziness -Presented to the emergency room last night, seen by orthopedics recommended observation and therapy ED Course: Temperature 98.4 blood pressure 126/67 pulse 76 respirate 18 oxygen sat 95% room air.  White count is 13.1 hemoglobin 9.5 and platelets 216.  Chemistry largely within normal.  X-ray of the right knee and head CT without contrast were both within normal.  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 -Discharged from day surgery on day of knee replacement, subsequently was not able to manage ambulation -Now with fall, severe Knee swelling -Physical therapy consulted -Continue gait training as tolerated, patient was unable to ambulate at all earlier today, and required 2-3+ assistance from staff to use the bedside commode -on eliquis for DVT prophylaxis   Benign essential HTN -continue IVF today, hold ARB and HCTZ   H/o fibromyalgia -reportedly on methylprednisolone daily for this, suspect there is an alternate diagnosis or indication   Obesity  DVT prophylaxis: eliquis Code Status: FUll Code Family Communication: no family at  bedside Disposition Plan:  Status is: Observation  The patient will require care spanning > 2 midnights and should be moved to inpatient because: Unsafe d/c plan, needs to be able to ambulate a bit more independently for safe DC home, required 2-3+ assistance to get to bedside commode this afternoon  Dispo: The patient is from: Home              Anticipated d/c is to: Home              Anticipated d/c date is: 1-2 days              Patient currently is not medically stable to d/c.  Consultants:   Orthopedics Dr. Luiz Blare   Procedures:   Antimicrobials:    Subjective: -Limited mobility, continues to have moderate pain and swelling of the right knee  Objective: Vitals:   06/02/20 0121 06/02/20 0543 06/02/20 0904 06/02/20 1538  BP: (!) 137/73 (!) 152/77 (!) 132/68 (!) 115/59  Pulse: 65 78 73 72  Resp: 16 16 18 18   Temp: (!) 97.5 F (36.4 C) 97.9 F (36.6 C) 98.2 F (36.8 C) 98.1 F (36.7 C)  TempSrc: Oral Oral Oral Oral  SpO2: 97% 97% 99% 98%  Weight:      Height:        Intake/Output Summary (Last 24 hours) at 06/02/2020 1602 Last data filed at 06/02/2020 0600 Gross per 24 hour  Intake 631.25 ml  Output --  Net 631.25 ml   Filed Weights   06/01/20 2114  Weight: 89.7 kg    Examination:  General exam: AAOx3, no distress Respiratory system: Clear to auscultation Cardiovascular system: S1 & S2 heard, RRR.  Gastrointestinal system: Abdomen is nondistended, soft and nontender.Normal bowel sounds heard. Central nervous system: Alert and oriented. No focal neurological deficits. Extremities: Profoundly swollen right knee with erythema ecchymosis, bruising Skin: As above Psychiatry: Judgement and insight appear normal. Mood & affect appropriate.     Data Reviewed:   CBC: Recent Labs  Lab 06/01/20 1453 06/02/20 0748  WBC 13.0* 10.0  HGB 9.5* 8.9*  HCT 30.1* 28.8*  MCV 106.0* 106.3*  PLT 216 201   Basic Metabolic Panel: Recent Labs  Lab 06/01/20 1453  06/02/20 0748  NA 141 141  K 4.0 3.8  CL 103 104  CO2 28 28  GLUCOSE 108* 112*  BUN 36* 27*  CREATININE 1.24* 1.11*  CALCIUM 8.8* 8.6*   GFR: Estimated Creatinine Clearance: 46.7 mL/min (A) (by C-G formula based on SCr of 1.11 mg/dL (H)). Liver Function Tests: Recent Labs  Lab 06/02/20 0748  AST 16  ALT 12  ALKPHOS 37*  BILITOT 0.7  PROT 6.6  ALBUMIN 3.4*   No results for input(s): LIPASE, AMYLASE in the last 168 hours. No results for input(s): AMMONIA in the last 168 hours. Coagulation Profile: No results for input(s): INR, PROTIME in the last 168 hours. Cardiac Enzymes: Recent Labs  Lab 06/01/20 1453  CKTOTAL 117   BNP (last 3 results) No results for input(s): PROBNP in the last 8760 hours. HbA1C: No results for input(s): HGBA1C in the last 72 hours. CBG: No results for input(s): GLUCAP in the last 168 hours. Lipid Profile: No results for input(s): CHOL, HDL, LDLCALC, TRIG, CHOLHDL, LDLDIRECT in the last 72 hours. Thyroid Function Tests: No results for input(s): TSH, T4TOTAL, FREET4, T3FREE, THYROIDAB in the last 72 hours. Anemia Panel: No results for input(s): VITAMINB12, FOLATE, FERRITIN, TIBC, IRON, RETICCTPCT in the last 72 hours. Urine analysis:    Component Value Date/Time   COLORURINE YELLOW 06/01/2020 1453   APPEARANCEUR CLEAR 06/01/2020 1453   LABSPEC 1.014 06/01/2020 1453   PHURINE 5.0 06/01/2020 1453   GLUCOSEU NEGATIVE 06/01/2020 1453   HGBUR NEGATIVE 06/01/2020 1453   BILIRUBINUR NEGATIVE 06/01/2020 1453   KETONESUR 5 (A) 06/01/2020 1453   PROTEINUR NEGATIVE 06/01/2020 1453   NITRITE NEGATIVE 06/01/2020 1453   LEUKOCYTESUR NEGATIVE 06/01/2020 1453   Sepsis Labs: (procalcitonin:4,lacticidven:4)  ) Recent Results (from the past 240 hour(s))  SARS Coronavirus 2 by RT PCR (hospital order, performed in [facility] hospital lab) Nasopharyngeal Nasopharyngeal Swab     Status: None   Collection Time: 06/01/20  5:54 PM   Specimen:  Nasopharyngeal Swab  Result Value Ref Range Status   SARS Coronavirus 2 NEGATIVE NEGATIVE Final    Comment: (NOTE) SARS-CoV-2 target nucleic acids are NOT DETECTED.  The SARS-CoV-2 RNA is generally detectable in upper and lower respiratory specimens during the acute phase of infection. The lowest concentration of SARS-CoV-2 viral copies this assay can detect is 250 copies / mL. A negative result does not preclude SARS-CoV-2 infection and should not be used as the sole basis for treatment or other patient management decisions.  A negative result may occur with improper specimen collection / handling, submission of specimen other than nasopharyngeal swab, presence of viral mutation(s) within the areas targeted by this assay, and inadequate number of viral copies (<250 copies / mL). A negative result must be combined with clinical observations, patient history, and epidemiological information.  Fact Sheet for Patients:   BoilerBrush.com.cy  Fact Sheet for Healthcare Providers: https://pope.com/  This test is not yet approved or  cleared by  the [employer] and has been authorized for detection and/or diagnosis of SARS-CoV-2 by FDA under an Emergency Use Authorization (EUA).  This EUA will remain in effect (meaning this test can be used) for the duration of the COVID-19 declaration under Section 564(b)(1) of the Act, 21 U.S.C. section 360bbb-3(b)(1), unless the authorization is terminated or revoked sooner.  Performed at [facility], [address] [place], [place] [address]          Radiology Studies: CT Head Wo Contrast  Result Date: 06/01/2020 CLINICAL DATA:  Head trauma, minor, normal mental status. EXAM: CT HEAD WITHOUT CONTRAST TECHNIQUE: Contiguous axial images were obtained from the base of the skull through the vertex without intravenous contrast. COMPARISON:  No pertinent prior studies available  for comparison. FINDINGS: Brain: Motion degraded examination. Mild generalized parenchymal atrophy. Moderate patchy hypoattenuation within the cerebral white matter is nonspecific, but consistent with chronic small vessel ischemic disease. There is no acute intracranial hemorrhage. No demarcated cortical infarct. No extra-axial fluid collection. No evidence of intracranial mass. No midline shift. Vascular: No hyperdense vessel. Skull: Normal. Negative for fracture or focal lesion. Sinuses/Orbits: Visualized orbits show no acute finding. No significant paranasal sinus disease or mastoid effusion at the imaged levels. IMPRESSION: Motion degraded examination. No evidence of acute intracranial abnormality. Mild generalized parenchymal atrophy with moderate chronic small vessel ischemic disease. Electronically Signed   By: Jackey Loge DO   On: 06/01/2020 14:50   DG Knee Complete 4 Views Right  Result Date: 06/01/2020 CLINICAL DATA:  Right knee pain following a fall. Status post right knee replacement two days ago. EXAM: RIGHT KNEE - COMPLETE 4+ VIEW COMPARISON:  08/05/2012. FINDINGS: Interval right total knee prosthesis in satisfactory position and alignment. No fracture or dislocation. No effusion. IMPRESSION: No fracture or dislocation. Electronically Signed   By: Beckie Salts M.D.   On: 06/01/2020 14:57   ECHOCARDIOGRAM COMPLETE  Result Date: 06/02/2020    ECHOCARDIOGRAM REPORT   Patient Name:   EKATERINA DENISE Date of Exam: 06/02/2020 Medical Rec #:  [id number]       Height:       65.5 in Accession #:    [id number]      Weight:       197.8 lb Date of Birth:  [birth year]       BSA:          1.980 m Patient Age:    79 years        BP:           132/68 mmHg Patient Gender: F               HR:           66 bpm. Exam Location:  Inpatient Procedure: 2D Echo, Cardiac Doppler and Color Doppler Indications:    R55 Syncope  History:        Patient has no prior history of Echocardiogram examinations.  Sonographer:     Roosvelt Maser RDCS Referring Phys: [phone] Phylicia Mcgaugh IMPRESSIONS  1. Left ventricular ejection fraction, by estimation, is 60 to 65%. The left ventricle has normal function. The left ventricle has no regional wall motion abnormalities. Left ventricular diastolic parameters are indeterminate.  2. Right ventricular systolic function is normal. The right ventricular size is normal. There is mildly elevated pulmonary artery systolic pressure.  3. Left atrial size was mild to moderately dilated.  4. The mitral valve is normal in structure. Trivial mitral valve regurgitation.  5. The aortic valve is tricuspid. Aortic valve regurgitation is not visualized.  6. The inferior vena cava is normal in size with greater than 50% respiratory variability, suggesting right atrial pressure of 3 mmHg. Comparison(s): No prior Echocardiogram. Conclusion(s)/Recommendation(s): Normal biventricular function without evidence of hemodynamically significant valvular heart disease. FINDINGS  Left Ventricle: Left ventricular ejection fraction, by estimation, is 60 to 65%. The left ventricle has normal function. The left ventricle has no regional wall motion abnormalities. The left ventricular internal cavity size was normal in size. There is  no left ventricular hypertrophy. Left ventricular diastolic parameters are indeterminate. Right Ventricle: The right ventricular size is normal. No increase in right ventricular wall thickness. Right ventricular systolic function is normal. There is mildly elevated pulmonary artery systolic pressure. The tricuspid regurgitant velocity is 2.93  m/s, and with an assumed right atrial pressure of 3 mmHg, the estimated right ventricular systolic pressure is 37.3 mmHg. Left Atrium: Left atrial size was mild to moderately dilated. Right Atrium: Right atrial size was normal in size. Pericardium: Trivial pericardial effusion is present. Presence of pericardial fat pad. Mitral Valve: The mitral valve is normal in  structure. Trivial mitral valve regurgitation. Tricuspid Valve: The tricuspid valve is normal in structure. Tricuspid valve regurgitation is mild. Aortic Valve: The aortic valve is tricuspid. Aortic valve regurgitation is not visualized. Pulmonic Valve: The pulmonic valve was not well visualized. Pulmonic valve regurgitation is trivial. Aorta: The aortic root and ascending aorta are structurally normal, with no evidence of dilitation. Venous: The inferior vena cava is normal in size with greater than 50% respiratory variability, suggesting right atrial pressure of 3 mmHg. IAS/Shunts: No atrial level shunt detected by color flow Doppler.  LEFT VENTRICLE PLAX 2D LVIDd:         5.00 cm     Diastology LVIDs:         3.30 cm     LV e' lateral:   9.03 cm/s LV PW:         1.00 cm     LV E/e' lateral: 11.4 LV IVS:        1.00 cm     LV e' medial:    8.59 cm/s LVOT diam:     1.90 cm     LV E/e' medial:  12.0 LV SV:         71 LV SV Index:   36 LVOT Area:     2.84 cm  LV Volumes (MOD) LV vol d, MOD A2C: 96.7 ml LV vol d, MOD A4C: 95.6 ml LV vol s, MOD A2C: 28.1 ml LV vol s, MOD A4C: 27.9 ml LV SV MOD A2C:     68.6 ml LV SV MOD A4C:     95.6 ml LV SV MOD BP:      68.1 ml RIGHT VENTRICLE RV Basal diam:  3.70 cm RV S prime:     17.10 cm/s TAPSE (M-mode): 3.4 cm LEFT ATRIUM           Index       RIGHT ATRIUM           Index LA diam:      3.70 cm 1.87 cm/m  RA Area:     17.20 cm LA Vol (A2C): 92.1 ml 46.52 ml/m RA Volume:   40.00 ml  20.20 ml/m LA Vol (A4C): 76.9 ml 38.84 ml/m  AORTIC VALVE LVOT Vmax:   117.00 cm/s LVOT Vmean:  70.300 cm/s LVOT VTI:    0.252 m  AORTA Ao Root diam: 3.40 cm Ao Asc diam:  3.30 cm MITRAL VALVE                TRICUSPID VALVE MV Area (PHT): 3.54 cm     TR Peak grad:   34.3 mmHg MV Decel Time: 214 msec     TR Vmax:        293.00 cm/s MV E velocity: 103.00 cm/s MV A velocity: 113.00 cm/s  SHUNTS MV E/A ratio:  0.91         Systemic VTI:  0.25 m                             Systemic Diam: 1.90 cm  Jodelle Red MD Electronically signed by Jodelle Red MD Signature Date/Time: 06/02/2020/3:19:30 PM    Final         Scheduled Meds: . apixaban  2.5 mg Oral BID  . calcium-vitamin D  1 tablet Oral Q breakfast  . citalopram  10 mg Oral Daily  . [START ON 06/04/2020] estradiol  2 g Vaginal Once per day on Mon Thu  . methylPREDNISolone  2 mg Oral Daily  . propranolol  20 mg Oral TID  . sodium chloride flush  3 mL Intravenous Once   Continuous Infusions: . sodium chloride       LOS: 0 days    Time spent:   Zannie Cove, MD [facility]  06/02/2020, 4:02 PM

## 2020-06-02 NOTE — Evaluation (Signed)
Physical Therapy Evaluation Patient Details Name: Kayla Logan MRN: [id number] DOB: Sep 01, 1941 Today's Date: 06/02/2020   History of Present Illness  Pt s/p R TKR 05/31/20 as day surgery and fell 06/01/20.  No fx per imaging but significant bruising.    Clinical Impression  Pt s/p very recent R TKR and fall at home the next day and now presents with decreased R LE strength/ROM, post op pain and min WB tolerance on R limiting functional mobility.  Pt hopes to progress to dc home with assist of significant other HHPT follow up.    Follow Up Recommendations Home health PT    Equipment Recommendations  3in1 (PT)    Recommendations for Other Services       Precautions / Restrictions Precautions Precautions: Fall;Knee Restrictions Weight Bearing Restrictions: No Other Position/Activity Restrictions: WBAT (per pt)      Mobility  Bed Mobility Overal bed mobility: Needs Assistance Bed Mobility: Supine to Sit;Sit to Supine     Supine to sit: Mod assist Sit to supine: Mod assist   General bed mobility comments: increased time with cues for sequence and use of L LE to self assist;  Physical assist for R LE management and to control trunk  Transfers Overall transfer level: Needs assistance Equipment used: Rolling walker (2 wheeled) Transfers: Sit to/from Stand Sit to Stand: Min assist;Mod assist         General transfer comment: cues for LE management and use of UEs to self assist  Ambulation/Gait Ambulation/Gait assistance: +2 physical assistance;Mod assist Gait Distance (Feet): 0 Feet Assistive device: Rolling walker (2 wheeled)   Gait velocity: decr   General Gait Details: Pt stood only at side of bed.  Pt tolerating min wt on R LE and unable to compensate with UEs to allow step with L foot  Stairs            Wheelchair Mobility    Modified Rankin (Stroke Patients Only)       Balance                                              Pertinent Vitals/Pain Pain Assessment: 0-10 Pain Score: 5  Pain Location: R knee Pain Descriptors / Indicators: Aching;Grimacing;Guarding;Sore Pain Intervention(s): Limited activity within patient's tolerance;Monitored during session;Premedicated before session;Ice applied    Home Living Family/patient expects to be discharged to:: Private residence Living Arrangements: Spouse/significant other Available Help at Discharge: Family Type of Home: House Home Access: Stairs to enter Entrance Stairs-Rails: Right Entrance Stairs-Number of Steps: 2 Home Layout: One level Home Equipment: Environmental consultant - 2 wheels;Cane - single point      Prior Function Level of Independence: Independent         Comments: IND prior to this TKR     Hand Dominance        Extremity/Trunk Assessment   Upper Extremity Assessment Upper Extremity Assessment: Overall WFL for tasks assessed    Lower Extremity Assessment Lower Extremity Assessment: RLE deficits/detail RLE Deficits / Details: AAROM at knee -8 - 40 - pain limited; 2-/5 strength at knee - pain limited       Communication   Communication: No difficulties  Cognition Arousal/Alertness: Awake/alert Behavior During Therapy: WFL for tasks assessed/performed Overall Cognitive Status: Within Functional Limits for tasks assessed  General Comments      Exercises Total Joint Exercises Ankle Circles/Pumps: AROM;Both;15 reps;Supine Quad Sets: AROM;Both;10 reps;Supine Heel Slides: AAROM;Right;10 reps;Supine Straight Leg Raises: AAROM;Right;10 reps;Supine   Assessment/Plan    PT Assessment Patient needs continued PT services  PT Problem List Decreased strength;Decreased range of motion;Decreased activity tolerance;Decreased balance;Decreased mobility;Decreased knowledge of use of DME;Pain;Obesity       PT Treatment Interventions DME instruction;Gait training;Stair training;Functional  mobility training;Therapeutic activities;Therapeutic exercise;Patient/family education;Balance training    PT Goals (Current goals can be found in the Care Plan section)  Acute Rehab PT Goals Patient Stated Goal: Regain IND PT Goal Formulation: With patient Time For Goal Achievement: 06/16/20 Potential to Achieve Goals: Fair    Frequency 7X/week   Barriers to discharge        Co-evaluation               AM-PAC PT "6 Clicks" Mobility  Outcome Measure Help needed turning from your back to your side while in a flat bed without using bedrails?: A Little Help needed moving from lying on your back to sitting on the side of a flat bed without using bedrails?: A Lot Help needed moving to and from a bed to a chair (including a wheelchair)?: A Lot Help needed standing up from a chair using your arms (e.g., wheelchair or bedside chair)?: A Lot Help needed to walk in hospital room?: A Lot Help needed climbing 3-5 steps with a railing? : Total 6 Click Score: 12    End of Session Equipment Utilized During Treatment: Gait belt Activity Tolerance: Patient limited by pain Patient left: in bed;with call bell/phone within reach;with bed alarm set Nurse Communication: Mobility status PT Visit Diagnosis: Difficulty in walking, not elsewhere classified (R26.2);Pain Pain - Right/Left: Right Pain - part of body: Knee    Time: 9675-9163 PT Time Calculation (min) (ACUTE ONLY): 40 min   Charges:   PT Evaluation $PT Eval Low Complexity: 1 Low PT Treatments $Therapeutic Exercise: 8-22 mins $Therapeutic Activity: 8-22 mins        Mauro Kaufmann PT Acute Rehabilitation Services Pager [phone] Office [phone]   Kayla Logan 06/02/2020, 12:15 PM

## 2020-06-03 DIAGNOSIS — W19XXXD Unspecified fall, subsequent encounter: Secondary | ICD-10-CM | POA: Diagnosis not present

## 2020-06-03 DIAGNOSIS — I10 Essential (primary) hypertension: Secondary | ICD-10-CM | POA: Diagnosis not present

## 2020-06-03 DIAGNOSIS — M1711 Unilateral primary osteoarthritis, right knee: Secondary | ICD-10-CM | POA: Diagnosis not present

## 2020-06-03 LAB — CBC
HCT: 26.4 % — ABNORMAL LOW (ref 36.0–46.0)
Hemoglobin: 8.3 g/dL — ABNORMAL LOW (ref 12.0–15.0)
MCH: 33.6 pg (ref 26.0–34.0)
MCHC: 31.4 g/dL (ref 30.0–36.0)
MCV: 106.9 fL — ABNORMAL HIGH (ref 80.0–100.0)
Platelets: 185 10*3/uL (ref 150–400)
RBC: 2.47 MIL/uL — ABNORMAL LOW (ref 3.87–5.11)
RDW: 14.2 % (ref 11.5–15.5)
WBC: 7.7 10*3/uL (ref 4.0–10.5)
nRBC: 0.3 % — ABNORMAL HIGH (ref 0.0–0.2)

## 2020-06-03 LAB — BASIC METABOLIC PANEL
Anion gap: 7 (ref 5–15)
BUN: 24 mg/dL — ABNORMAL HIGH (ref 8–23)
CO2: 28 mmol/L (ref 22–32)
Calcium: 8.5 mg/dL — ABNORMAL LOW (ref 8.9–10.3)
Chloride: 106 mmol/L (ref 98–111)
Creatinine, Ser: 1.03 mg/dL — ABNORMAL HIGH (ref 0.44–1.00)
GFR calc Af Amer: >60 mL/min (ref >60)
GFR calc non Af Amer: 52 mL/min — ABNORMAL LOW (ref >60)
Glucose, Bld: 109 mg/dL — ABNORMAL HIGH (ref 70–99)
Potassium: 4.1 mmol/L (ref 3.5–5.1)
Sodium: 141 mmol/L (ref 135–145)

## 2020-06-03 NOTE — Progress Notes (Addendum)
PROGRESS NOTE    [name]  WGN:[id number] DOB: 1941/07/21 DOA: 06/01/2020 PCP: Johny Blamer, MD  Brief Narrative:Kayla Logan is a 79 y.o. female with medical history of hypertension, obesity, osteoarthritis underwent right total knee replacement 2 days prior, reports that postop day 1 she was unable to get out of bed, hence does not remember eating or drinking very much, the following morning she woke up tried to stand up and next thing she remembers is waking up on the floor. -She denies any chest pain, shortness of breath, palpitations or dizziness -Presented to the emergency room, seen by orthopedics recommended observation and therapy ED Course: Temperature 98.4 blood pressure 126/67 pulse 76 respirate 18 oxygen sat 95% room air.  White count is 13.1 hemoglobin 9.5 and platelets 216.  Chemistry -WNL  X-ray of the right knee and head CT without contrast were both within normal limits  Assessment & Plan:  Fall, suspected syncope -history indicates loss of consciousness, EKG with Q waves in inferior leads, 2D echocardiogram was unremarkable -no events on telemetry -Orthostatics not done initially, hydrated with NS -stop IVF today, ARB and HCTZ on hold -PT OT eval completed, now SNF recommended -unsafe DC home alone at this time  Recent right total knee arthroplasty 7/21 -Discharged from day surgery on day of knee replacement, subsequently was not able to ambulate independently -admitted with fall, severe Knee swelling -Ortho consulted, recommended PT -Physical therapy consulted, now SNF recommended -TOC consult for ST rehab -on eliquis for DVT prophylaxis   Benign essential HTN -stop IVF, holding ARB and HCTZ   H/o fibromyalgia -reportedly on methylprednisolone daily for this, suspect there is an alternate diagnosis or indication   Obesity  DVT prophylaxis: eliquis Code Status: Full Code Family Communication: no family at bedside Disposition Plan:  Status is:  Observation  The patient will require care spanning > 2 midnights and should be moved to inpatient because: Unsafe d/c plan, lives alone, post TKA and unable to ambulate independently Dispo: The patient is from: Home              Anticipated d/c is to: SNF              Anticipated d/c date is: when SNF bed available, ToC consulted              Patient currently is medically stable to d/c.  Consultants:   Orthopedics Dr. Luiz Blare   Procedures:   Antimicrobials:    Subjective: -Ambulating with assistance, continues to have moderate pain and swelling of her right knee and lower leg   Objective: Vitals:   06/02/20 1538 06/02/20 1813 06/02/20 2040 06/03/20 0448  BP: (!) 115/59 (!) 133/72 (!) 134/79 127/67  Pulse: 72 81 76 71  Resp: Temp: 98.1 F (36.7 C) 98.6 F (37 C) 98.8 F (37.1 C) 98.2 F (36.8 C)  TempSrc: Oral Oral Oral Oral  SpO2: 98% 96% 96% 95%  Weight:      Height:        Intake/Output Summary (Last 24 hours) at 06/03/2020 1331 Last data filed at 06/03/2020 0157 Gross per 24 hour  Intake 243.71 ml  Output --  Net 243.71 ml   Filed Weights   06/01/20 2114  Weight: 89.7 kg    Examination:  General exam: Obese pleasant female sitting up in bed, AAOx3, no distress CVS: S1-S2, regular rate rhythm Lungs: Clear bilaterally Abdomen: Soft, nontender, bowel sounds present Extremities: Swelling erythema and bruising  of her right foot knee and lower leg Skin: As above Psychiatry: Judgement and insight appear normal. Mood & affect appropriate.     Data Reviewed:   CBC: Recent Labs  Lab 06/01/20 1453 06/02/20 0748 06/03/20 0425  WBC 13.0* 10.0 7.7  HGB 9.5* 8.9* 8.3*  HCT 30.1* 28.8* 26.4*  MCV 106.0* 106.3* 106.9*  PLT 216 201 185   Basic Metabolic Panel: Recent Labs  Lab 06/01/20 1453 06/02/20 0748 06/03/20 0425  NA 141 141 141  K 4.0 3.8 4.1  CL 103 104 106  CO2 28 28 28   GLUCOSE 108* 112* 109*  BUN 36* 27* 24*  CREATININE  1.24* 1.11* 1.03*  CALCIUM 8.8* 8.6* 8.5*   GFR: Estimated Creatinine Clearance: 50.3 mL/min (A) (by C-G formula based on SCr of 1.03 mg/dL (H)). Liver Function Tests: Recent Labs  Lab 06/02/20 0748  AST 16  ALT 12  ALKPHOS 37*  BILITOT 0.7  PROT 6.6  ALBUMIN 3.4*   No results for input(s): LIPASE, AMYLASE in the last 168 hours. No results for input(s): AMMONIA in the last 168 hours. Coagulation Profile: No results for input(s): INR, PROTIME in the last 168 hours. Cardiac Enzymes: Recent Labs  Lab 06/01/20 1453  CKTOTAL 117   BNP (last 3 results) No results for input(s): PROBNP in the last 8760 hours. HbA1C: No results for input(s): HGBA1C in the last 72 hours. CBG: No results for input(s): GLUCAP in the last 168 hours. Lipid Profile: No results for input(s): CHOL, HDL, LDLCALC, TRIG, CHOLHDL, LDLDIRECT in the last 72 hours. Thyroid Function Tests: No results for input(s): TSH, T4TOTAL, FREET4, T3FREE, THYROIDAB in the last 72 hours. Anemia Panel: No results for input(s): VITAMINB12, FOLATE, FERRITIN, TIBC, IRON, RETICCTPCT in the last 72 hours. Urine analysis:    Component Value Date/Time   COLORURINE YELLOW 06/01/2020 1453   APPEARANCEUR CLEAR 06/01/2020 1453   LABSPEC 1.014 06/01/2020 1453   PHURINE 5.0 06/01/2020 1453   GLUCOSEU NEGATIVE 06/01/2020 1453   HGBUR NEGATIVE 06/01/2020 1453   BILIRUBINUR NEGATIVE 06/01/2020 1453   KETONESUR 5 (A) 06/01/2020 1453   PROTEINUR NEGATIVE 06/01/2020 1453   NITRITE NEGATIVE 06/01/2020 1453   LEUKOCYTESUR NEGATIVE 06/01/2020 1453   Sepsis Labs: @LABRCNTIP (procalcitonin:4,lacticidven:4)  ) Recent Results (from the past 240 hour(s))  SARS Coronavirus 2 by RT PCR (hospital order, performed in [facility] hospital lab) Nasopharyngeal Nasopharyngeal Swab     Status: None   Collection Time: 06/01/20  5:54 PM   Specimen: Nasopharyngeal Swab  Result Value Ref Range Status   SARS Coronavirus 2 NEGATIVE NEGATIVE Final     Comment: (NOTE) SARS-CoV-2 target nucleic acids are NOT DETECTED.  The SARS-CoV-2 RNA is generally detectable in upper and lower respiratory specimens during the acute phase of infection. The lowest concentration of SARS-CoV-2 viral copies this assay can detect is 250 copies / mL. A negative result does not preclude SARS-CoV-2 infection and should not be used as the sole basis for treatment or other patient management decisions.  A negative result may occur with improper specimen collection / handling, submission of specimen other than nasopharyngeal swab, presence of viral mutation(s) within the areas targeted by this assay, and inadequate number of viral copies (<250 copies / mL). A negative result must be combined with clinical observations, patient history, and epidemiological information.  Fact Sheet for Patients:   BoilerBrush.com.cyhttps://www.fda.gov/media/136312/download  Fact Sheet for Healthcare Providers: https://pope.com/https://www.fda.gov/media/136313/download  This test is not yet approved or  cleared by the [place] FDA and  has been authorized for detection and/or diagnosis of SARS-CoV-2 by FDA under an Emergency Use Authorization (EUA).  This EUA will remain in effect (meaning this test can be used) for the duration of the COVID-19 declaration under Section 564(b)(1) of the Act, 21 U.S.C. section 360bbb-3(b)(1), unless the authorization is terminated or revoked sooner.  Performed at [facility], [address] [place], [place] [address]          Radiology Studies: CT Head Wo Contrast  Result Date: 06/01/2020 CLINICAL DATA:  Head trauma, minor, normal mental status. EXAM: CT HEAD WITHOUT CONTRAST TECHNIQUE: Contiguous axial images were obtained from the base of the skull through the vertex without intravenous contrast. COMPARISON:  No pertinent prior studies available for comparison. FINDINGS: Brain: Motion degraded examination. Mild generalized parenchymal atrophy.  Moderate patchy hypoattenuation within the cerebral white matter is nonspecific, but consistent with chronic small vessel ischemic disease. There is no acute intracranial hemorrhage. No demarcated cortical infarct. No extra-axial fluid collection. No evidence of intracranial mass. No midline shift. Vascular: No hyperdense vessel. Skull: Normal. Negative for fracture or focal lesion. Sinuses/Orbits: Visualized orbits show no acute finding. No significant paranasal sinus disease or mastoid effusion at the imaged levels. IMPRESSION: Motion degraded examination. No evidence of acute intracranial abnormality. Mild generalized parenchymal atrophy with moderate chronic small vessel ischemic disease. Electronically Signed   By: Jackey Loge DO   On: 06/01/2020 14:50   DG Knee Complete 4 Views Right  Result Date: 06/01/2020 CLINICAL DATA:  Right knee pain following a fall. Status post right knee replacement two days ago. EXAM: RIGHT KNEE - COMPLETE 4+ VIEW COMPARISON:  08/05/2012. FINDINGS: Interval right total knee prosthesis in satisfactory position and alignment. No fracture or dislocation. No effusion. IMPRESSION: No fracture or dislocation. Electronically Signed   By: Beckie Salts M.D.   On: 06/01/2020 14:57   ECHOCARDIOGRAM COMPLETE  Result Date: 06/02/2020    ECHOCARDIOGRAM REPORT   Patient Name:   HASNA STEFANIK Date of Exam: 06/02/2020 Medical Rec #:  [id number]       Height:       65.5 in Accession #:    [id number]      Weight:       197.8 lb Date of Birth:  1941-04-11       BSA:          1.980 m Patient Age:    79 years        BP:           132/68 mmHg Patient Gender: F               HR:           66 bpm. Exam Location:  Inpatient Procedure: 2D Echo, Cardiac Doppler and Color Doppler Indications:    R55 Syncope  History:        Patient has no prior history of Echocardiogram examinations.  Sonographer:    Roosvelt Maser RDCS Referring Phys: [phone] Daymond Cordts IMPRESSIONS  1. Left ventricular ejection fraction,  by estimation, is 60 to 65%. The left ventricle has normal function. The left ventricle has no regional wall motion abnormalities. Left ventricular diastolic parameters are indeterminate.  2. Right ventricular systolic function is normal. The right ventricular size is normal. There is mildly elevated pulmonary artery systolic pressure.  3. Left atrial size was mild to moderately dilated.  4. The mitral valve is normal in structure. Trivial mitral valve regurgitation.  5. The aortic valve is  tricuspid. Aortic valve regurgitation is not visualized.  6. The inferior vena cava is normal in size with greater than 50% respiratory variability, suggesting right atrial pressure of 3 mmHg. Comparison(s): No prior Echocardiogram. Conclusion(s)/Recommendation(s): Normal biventricular function without evidence of hemodynamically significant valvular heart disease. FINDINGS  Left Ventricle: Left ventricular ejection fraction, by estimation, is 60 to 65%. The left ventricle has normal function. The left ventricle has no regional wall motion abnormalities. The left ventricular internal cavity size was normal in size. There is  no left ventricular hypertrophy. Left ventricular diastolic parameters are indeterminate. Right Ventricle: The right ventricular size is normal. No increase in right ventricular wall thickness. Right ventricular systolic function is normal. There is mildly elevated pulmonary artery systolic pressure. The tricuspid regurgitant velocity is 2.93  m/s, and with an assumed right atrial pressure of 3 mmHg, the estimated right ventricular systolic pressure is 37.3 mmHg. Left Atrium: Left atrial size was mild to moderately dilated. Right Atrium: Right atrial size was normal in size. Pericardium: Trivial pericardial effusion is present. Presence of pericardial fat pad. Mitral Valve: The mitral valve is normal in structure. Trivial mitral valve regurgitation. Tricuspid Valve: The tricuspid valve is normal in structure.  Tricuspid valve regurgitation is mild. Aortic Valve: The aortic valve is tricuspid. Aortic valve regurgitation is not visualized. Pulmonic Valve: The pulmonic valve was not well visualized. Pulmonic valve regurgitation is trivial. Aorta: The aortic root and ascending aorta are structurally normal, with no evidence of dilitation. Venous: The inferior vena cava is normal in size with greater than 50% respiratory variability, suggesting right atrial pressure of 3 mmHg. IAS/Shunts: No atrial level shunt detected by color flow Doppler.  LEFT VENTRICLE PLAX 2D LVIDd:         5.00 cm     Diastology LVIDs:         3.30 cm     LV e' lateral:   9.03 cm/s LV PW:         1.00 cm     LV E/e' lateral: 11.4 LV IVS:        1.00 cm     LV e' medial:    8.59 cm/s LVOT diam:     1.90 cm     LV E/e' medial:  12.0 LV SV:         71 LV SV Index:   36 LVOT Area:     2.84 cm  LV Volumes (MOD) LV vol d, MOD A2C: 96.7 ml LV vol d, MOD A4C: 95.6 ml LV vol s, MOD A2C: 28.1 ml LV vol s, MOD A4C: 27.9 ml LV SV MOD A2C:     68.6 ml LV SV MOD A4C:     95.6 ml LV SV MOD BP:      68.1 ml RIGHT VENTRICLE RV Basal diam:  3.70 cm RV S prime:     17.10 cm/s TAPSE (M-mode): 3.4 cm LEFT ATRIUM           Index       RIGHT ATRIUM           Index LA diam:      3.70 cm 1.87 cm/m  RA Area:     17.20 cm LA Vol (A2C): 92.1 ml 46.52 ml/m RA Volume:   40.00 ml  20.20 ml/m LA Vol (A4C): 76.9 ml 38.84 ml/m  AORTIC VALVE LVOT Vmax:   117.00 cm/s LVOT Vmean:  70.300 cm/s LVOT VTI:    0.252 m  AORTA Ao Root diam: 3.40  cm Ao Asc diam:  3.30 cm MITRAL VALVE                TRICUSPID VALVE MV Area (PHT): 3.54 cm     TR Peak grad:   34.3 mmHg MV Decel Time: 214 msec     TR Vmax:        293.00 cm/s MV E velocity: 103.00 cm/s MV A velocity: 113.00 cm/s  SHUNTS MV E/A ratio:  0.91         Systemic VTI:  0.25 m                             Systemic Diam: 1.90 cm Jodelle Red MD Electronically signed by Jodelle Red MD Signature Date/Time:  06/02/2020/3:19:30 PM    Final         Scheduled Meds: . apixaban  2.5 mg Oral BID  . calcium-vitamin D  1 tablet Oral Q breakfast  . citalopram  10 mg Oral Daily  . [START ON 06/04/2020] estradiol  2 g Vaginal Once per day on Mon Thu  . methylPREDNISolone  2 mg Oral Daily  . propranolol  20 mg Oral TID  . sodium chloride flush  3 mL Intravenous Once   Continuous Infusions:    LOS: 0 days    Time spent:   Zannie Cove, MD [facility]  06/03/2020, 1:31 PM

## 2020-06-03 NOTE — Progress Notes (Signed)
Physical Therapy Treatment Patient Details Name: Kayla Logan MRN: [id number] DOB: August 16, 1941 Today's Date: 06/03/2020    History of Present Illness Pt s/p R TKR 05/31/20 as day surgery and fell 06/01/20.  No fx per imaging but significant bruising.      PT Comments    Pt progressing steadily but slowly with mobility and continues ltd by pain and fatigues easily.  Pt requiring significant assist to stand from low bed but did ambulate increased distance into hall before stating "I feel like I could faint"  BP 131/101. HR 66  O2 100% - RN and physician aware.    Follow Up Recommendations  SNF     Equipment Recommendations  3in1 (PT)    Recommendations for Other Services       Precautions / Restrictions Precautions Precautions: Fall;Knee Required Braces or Orthoses: Knee Immobilizer - Right Knee Immobilizer - Right: Discontinue once straight leg raise with < 10 degree lag Restrictions Weight Bearing Restrictions: No Other Position/Activity Restrictions: WBAT    Mobility  Bed Mobility Overal bed mobility: Needs Assistance Bed Mobility: Supine to Sit     Supine to sit: Min assist     General bed mobility comments: increased time with cues for sequence and use of L LE to self assist;  Physical assist for R LE management and to control trunk  Transfers Overall transfer level: Needs assistance Equipment used: Rolling walker (2 wheeled) Transfers: Sit to/from Stand Sit to Stand: Mod assist         General transfer comment: cues for LE management and use of UEs to self assist  Ambulation/Gait Ambulation/Gait assistance: Min assist;+2 safety/equipment Gait Distance (Feet): 26 Feet Assistive device: Rolling walker (2 wheeled) Gait Pattern/deviations: Step-to pattern;Decreased step length - right;Decreased step length - left;Shuffle;Trunk flexed Gait velocity: decr   General Gait Details: Cues for sequence, posture, position from RW and increased UE WB.  Pt limited by  fatigue but marked improvement over earlier session   Stairs             Wheelchair Mobility    Modified Rankin (Stroke Patients Only)       Balance Overall balance assessment: Needs assistance Sitting-balance support: No upper extremity supported;Feet supported Sitting balance-Leahy Scale: Good     Standing balance support: Bilateral upper extremity supported Standing balance-Leahy Scale: Poor                              Cognition Arousal/Alertness: Awake/alert Behavior During Therapy: WFL for tasks assessed/performed Overall Cognitive Status: Within Functional Limits for tasks assessed                                        Exercises Total Joint Exercises Ankle Circles/Pumps: AROM;Both;15 reps;Supine Quad Sets: AROM;Both;10 reps;Supine Heel Slides: AAROM;Right;Supine;15 reps Straight Leg Raises: AAROM;Right;10 reps;Supine    General Comments        Pertinent Vitals/Pain Pain Assessment: 0-10 Pain Score: 5  Pain Location: R knee Pain Descriptors / Indicators: Aching;Grimacing;Guarding;Sore Pain Intervention(s): Limited activity within patient's tolerance;Monitored during session;Premedicated before session;Ice applied    Home Living                      Prior Function            PT Goals (current goals can now be found in the care  plan section) Acute Rehab PT Goals Patient Stated Goal: Regain IND PT Goal Formulation: With patient Time For Goal Achievement: 06/16/20 Potential to Achieve Goals: Fair Progress towards PT goals: Progressing toward goals    Frequency    7X/week      PT Plan Discharge plan needs to be updated    Co-evaluation              AM-PAC PT "6 Clicks" Mobility   Outcome Measure  Help needed turning from your back to your side while in a flat bed without using bedrails?: A Little Help needed moving from lying on your back to sitting on the side of a flat bed without using  bedrails?: A Little Help needed moving to and from a bed to a chair (including a wheelchair)?: A Lot Help needed standing up from a chair using your arms (e.g., wheelchair or bedside chair)?: A Lot Help needed to walk in hospital room?: A Little Help needed climbing 3-5 steps with a railing? : Total 6 Click Score: 14    End of Session Equipment Utilized During Treatment: Gait belt;Right knee immobilizer Activity Tolerance: Patient limited by pain;Patient limited by fatigue Patient left: in chair;with call bell/phone within reach;with chair alarm set Nurse Communication: Mobility status PT Visit Diagnosis: Difficulty in walking, not elsewhere classified (R26.2);Pain Pain - Right/Left: Right Pain - part of body: Knee     Time: 9326-7124 PT Time Calculation (min) (ACUTE ONLY): 34 min  Charges:  $Gait Training: 8-22 mins $Therapeutic Exercise: 8-22 mins                     Mauro Kaufmann PT Acute Rehabilitation Services Pager [phone] Office [phone]    Saliah Crisp 06/03/2020, 11:48 AM

## 2020-06-03 NOTE — TOC Initial Note (Signed)
Transition of Care [facility]) - Initial/Assessment Note    Patient Details  Name: Kayla Logan MRN: [id number] Date of Birth: 04-Feb-1941  [facility]) CM/SW Contact:    Marina Goodell Phone Number: [phone] 06/03/2020, 4:38 PM  Clinical Narrative:                  CSW the spoke with the patient about SNF placement process, including timeline and insurance authorization process.  Patient stated her preferred SNF is [facility] and would like to focus on SNF close to [place].    CSW sent Fl2 for signature, PASRR# [id number].  Expected Discharge Plan: Skilled Nursing Facility Barriers to Discharge: SNF Pending bed offer, Insurance Authorization   Patient Goals and CMS Choice Patient states their goals for this hospitalization and ongoing recovery are:: To go to [facility] and return home CMS Medicare.gov Compare Post Acute Care list provided to:: Patient Choice offered to / list presented to : Patient  Expected Discharge Plan and Services Expected Discharge Plan: Skilled Nursing Facility In-house Referral: Clinical Social Work     Living arrangements for the past 2 months: Single Family Home                                      Prior Living Arrangements/Services Living arrangements for the past 2 months: Single Family Home Lives with:: Domestic Partner Patient language and need for interpreter reviewed:: Yes Do you feel safe going back to the place where you live?: Yes      Need for Family Participation in Patient Care: Yes (Comment) Care giver support system in place?: Yes (comment)   Criminal Activity/Legal Involvement Pertinent to Current Situation/Hospitalization: No - Comment as needed  Activities of Daily Living Home Assistive Devices/Equipment: Eyeglasses, Grab bars in shower ADL Screening (condition at time of admission) Patient's cognitive ability adequate to safely complete daily activities?: Yes Is the patient deaf or have  difficulty hearing?: No Does the patient have difficulty seeing, even when wearing glasses/contacts?: No Does the patient have difficulty concentrating, remembering, or making decisions?: No Patient able to express need for assistance with ADLs?: Yes Does the patient have difficulty dressing or bathing?: Yes Independently performs ADLs?: No Communication: Independent Dressing (OT): Needs assistance Is this a change from baseline?: Pre-admission baseline Grooming: Independent Feeding: Independent Bathing: Needs assistance Is this a change from baseline?: Pre-admission baseline Toileting: Needs assistance Is this a change from baseline?: Pre-admission baseline In/Out Bed: Needs assistance Is this a change from baseline?: Pre-admission baseline Walks in Home: Dependent Is this a change from baseline?: Pre-admission baseline Does the patient have difficulty walking or climbing stairs?: Yes Weakness of Legs: Both Weakness of Arms/Hands: Both  Permission Sought/Granted Permission sought to share information with : Facility Industrial/product designer granted to share information with : Yes, Verbal Permission Granted  Share Information with NAME: Kerney Elbe     Permission granted to share info w Relationship: Significant other  Permission granted to share info w Contact Information: [phone]  Emotional Assessment Appearance:: Appears stated age Attitude/Demeanor/Rapport: Engaged, Self-Confident Affect (typically observed): Accepting, Appropriate Orientation: : Oriented to Self, Oriented to Place, Oriented to  Time, Oriented to Situation   Psych Involvement: No (comment)  Admission diagnosis:  Fall [W19.XXXA] Fall in home, initial encounter [W19.XXXA, Y92.009] Postoperative stiffness of total knee replacement, initial encounter (HCC) [T84.89XA, M25.669, Z96.659] Patient Active Problem List  Diagnosis Date Noted  . Fall 06/01/2020  . Benign essential HTN 06/01/2020   . Osteoarthrosis, localized, primary, knee, right 06/01/2020   PCP:  Johny Blamer, MD Pharmacy:   [facility] - [place] - [address] [address] [address] [address] [place] [address] Phone: [phone] Fax: [phone]     Social Determinants of Health (SDOH) Interventions    Readmission Risk Interventions No flowsheet data found.

## 2020-06-03 NOTE — Progress Notes (Signed)
Physical Therapy Treatment Patient Details Name: Kayla Logan MRN: [id number] DOB: 11-19-1940 Today's Date: 06/03/2020    History of Present Illness Pt s/p R TKR 05/31/20 as day surgery and fell 06/01/20.  No fx per imaging but significant bruising.      PT Comments    Pt continues very motivated and progressing slowly but steadily with mobility.  This pm, pt ambulated increased distance in hall - no c/o dizziness but continues to fatigue easily.   Follow Up Recommendations  SNF     Equipment Recommendations  3in1 (PT)    Recommendations for Other Services       Precautions / Restrictions Precautions Precautions: Fall;Knee Required Braces or Orthoses: Knee Immobilizer - Right Knee Immobilizer - Right: Discontinue once straight leg raise with < 10 degree lag Restrictions Weight Bearing Restrictions: No Other Position/Activity Restrictions: WBAT    Mobility  Bed Mobility Overal bed mobility: Needs Assistance Bed Mobility: Supine to Sit     Supine to sit: Min assist     General bed mobility comments: increased time with cues for sequence and use of L LE to self assist;  Physical assist for R LE management and to control trunk  Transfers Overall transfer level: Needs assistance Equipment used: Rolling walker (2 wheeled) Transfers: Sit to/from Stand Sit to Stand: Min assist;From elevated surface         General transfer comment: cues for LE management and use of UEs to self assist  Ambulation/Gait Ambulation/Gait assistance: Min assist Gait Distance (Feet): 48 Feet Assistive device: Rolling walker (2 wheeled) Gait Pattern/deviations: Step-to pattern;Decreased step length - right;Decreased step length - left;Shuffle;Trunk flexed Gait velocity: decr   General Gait Details: Cues for sequence, posture, position from RW and increased UE WB.     Stairs             Wheelchair Mobility    Modified Rankin (Stroke Patients Only)       Balance Overall  balance assessment: Needs assistance Sitting-balance support: No upper extremity supported;Feet supported Sitting balance-Leahy Scale: Good     Standing balance support: Bilateral upper extremity supported Standing balance-Leahy Scale: Poor                              Cognition Arousal/Alertness: Awake/alert Behavior During Therapy: WFL for tasks assessed/performed Overall Cognitive Status: Within Functional Limits for tasks assessed                                        Exercises      General Comments        Pertinent Vitals/Pain Pain Assessment: 0-10 Pain Score: 6  Pain Location: R knee Pain Descriptors / Indicators: Aching;Grimacing;Guarding;Sore Pain Intervention(s): Limited activity within patient's tolerance;Monitored during session;Premedicated before session;Ice applied    Home Living                      Prior Function            PT Goals (current goals can now be found in the care plan section) Acute Rehab PT Goals Patient Stated Goal: Regain IND PT Goal Formulation: With patient Time For Goal Achievement: 06/16/20 Potential to Achieve Goals: Fair Progress towards PT goals: Progressing toward goals    Frequency    7X/week      PT Plan Current plan remains  appropriate    Co-evaluation              AM-PAC PT "6 Clicks" Mobility   Outcome Measure  Help needed turning from your back to your side while in a flat bed without using bedrails?: A Little Help needed moving from lying on your back to sitting on the side of a flat bed without using bedrails?: A Little Help needed moving to and from a bed to a chair (including a wheelchair)?: A Lot Help needed standing up from a chair using your arms (e.g., wheelchair or bedside chair)?: A Lot Help needed to walk in hospital room?: A Little Help needed climbing 3-5 steps with a railing? : Total 6 Click Score: 14    End of Session Equipment Utilized During  Treatment: Gait belt;Right knee immobilizer Activity Tolerance: Patient limited by pain;Patient limited by fatigue Patient left: in chair;with call bell/phone within reach;with chair alarm set Nurse Communication: Mobility status PT Visit Diagnosis: Difficulty in walking, not elsewhere classified (R26.2);Pain Pain - Right/Left: Right Pain - part of body: Knee     Time: 1610-9604 PT Time Calculation (min) (ACUTE ONLY): 25 min  Charges:  $Gait Training: 23-37 mins                     Mauro Kaufmann PT Acute Rehabilitation Services Pager [phone] Office [phone]    Rayaan Lorah 06/03/2020, 3:51 PM

## 2020-06-03 NOTE — NC FL2 (Signed)
[place] MEDICAID FL2 LEVEL OF CARE SCREENING TOOL     IDENTIFICATION  Patient Name: Kayla Logan Birthdate: [birth year] Sex: female Admission Date (Current Location): 06/01/2020  [facility] and [place] Number:  Producer, television/film/video and Address:  [facility]  [address] [place], [place] [address]      Provider Number: [id number]  Attending Physician Name and Address:  Zannie Cove, MD  Relative Name and Phone Number:  Kerney Elbe [phone]    Current Level of Care: Hospital Recommended Level of Care: Skilled Nursing Facility Prior Approval Number:    Date Approved/Denied:   PASRR Number: [id number]  Discharge Plan: SNF    Current Diagnoses: Patient Active Problem List   Diagnosis Date Noted  . Fall 06/01/2020  . Benign essential HTN 06/01/2020  . Osteoarthrosis, localized, primary, knee, right 06/01/2020    Orientation RESPIRATION BLADDER Height & Weight     Self, Time, Situation, Place  Normal Continent Weight: 197 lb 12 oz (89.7 kg) Height:  5' 5.5" (166.4 cm)  BEHAVIORAL SYMPTOMS/MOOD NEUROLOGICAL BOWEL NUTRITION STATUS      Continent Diet  AMBULATORY STATUS COMMUNICATION OF NEEDS Skin   Limited Assist Verbally Normal                       Personal Care Assistance Level of Assistance  Bathing, Feeding, Dressing Bathing Assistance: Limited assistance Feeding assistance: Limited assistance Dressing Assistance: Limited assistance     Functional Limitations Info             SPECIAL CARE FACTORS FREQUENCY                       Contractures Contractures Info: Not present    Additional Factors Info                  Current Medications (06/03/2020):  This is the current hospital active medication list Current Facility-Administered Medications  Medication Dose Route Frequency Provider Last Rate Last Admin  . acetaminophen (TYLENOL) tablet 1,000 mg  1,000 mg Oral Q6H PRN Mikeal Hawthorne, Mohammad L, MD      .  acetaminophen-codeine (TYLENOL #3) 300-30 MG per tablet 1 tablet  1 tablet Oral TID PRN Rometta Emery, MD   1 tablet at 06/03/20 1358  . apixaban (ELIQUIS) tablet 2.5 mg  2.5 mg Oral BID Earlie Lou L, MD   2.5 mg at 06/03/20 0824  . calcium-vitamin D (OSCAL WITH D) 500-200 MG-UNIT per tablet 1 tablet  1 tablet Oral Q breakfast Rometta Emery, MD   1 tablet at 06/02/20 0846  . citalopram (CELEXA) tablet 10 mg  10 mg Oral Daily Rometta Emery, MD   10 mg at 06/03/20 0825  . cyclobenzaprine (FLEXERIL) tablet 5 mg  5 mg Oral TID PRN Rometta Emery, MD   5 mg at 06/02/20 1012  . [START ON 06/04/2020] estradiol (ESTRACE) vaginal cream 2 g  2 g Vaginal Once per day on Mon Thu Garba, Mohammad L, MD      . methylPREDNISolone (MEDROL) tablet 2 mg  2 mg Oral Daily Rometta Emery, MD   2 mg at 06/03/20 0910  . ondansetron (ZOFRAN) tablet 4 mg  4 mg Oral Q6H PRN Rometta Emery, MD       Or  . ondansetron (ZOFRAN) injection 4 mg  4 mg Intravenous Q6H PRN Rometta Emery, MD      . propranolol (INDERAL) tablet 20 mg  20 mg Oral TID Rometta Emery, MD   20 mg at 06/03/20 1559  . sodium chloride flush (NS) 0.9 % injection 3 mL  3 mL Intravenous Once Linwood Dibbles, MD      . traMADol Janean Sark) tablet 50 mg  50 mg Oral Q8H PRN Rometta Emery, MD      . traMADol Janean Sark) tablet 50 mg  50 mg Oral Q6H PRN Rometta Emery, MD   50 mg at 06/03/20 0825     Discharge Medications: Please see discharge summary for a list of discharge medications.  Relevant Imaging Results:  Relevant Lab Results:   Additional Information SS# [id number]  Joseph Art, LCSWA

## 2020-06-03 NOTE — Evaluation (Signed)
Occupational Therapy Evaluation Patient Details Name: Kayla Logan MRN: [id number] DOB: Feb 28, 1941 Today's Date: 06/03/2020    History of Present Illness Pt s/p R TKR 05/31/20 as day surgery and fell 06/01/20.  No fx per imaging but significant bruising.     Clinical Impression   Pt admitted with the above.   Pt currently with functional limitations due to the deficits listed below (see OT Problem List).  Pt will benefit from skilled OT to increase their safety and independence with ADL and functional mobility for ADL to facilitate discharge to venue listed below.   Pt does agree she needs SNF.  Pt was using bed pan in recliner upon OT arrival. Encouraged pt to use bed side commode rather than bed pan in chair     Follow Up Recommendations  SNF    Equipment Recommendations  None recommended by OT    Recommendations for Other Services       Precautions / Restrictions Precautions Precautions: Fall;Knee Required Braces or Orthoses: Knee Immobilizer - Right Knee Immobilizer - Right: Discontinue once straight leg raise with < 10 degree lag Restrictions Weight Bearing Restrictions: No Other Position/Activity Restrictions: WBAT      Mobility Bed Mobility Overal bed mobility: Needs Assistance Bed Mobility: Supine to Sit     Supine to sit: Min assist     General bed mobility comments: pt in chair  Transfers Overall transfer level: Needs assistance Equipment used: Rolling walker (2 wheeled) Transfers: Sit to/from Stand Sit to Stand: From elevated surface;Mod assist         General transfer comment: sit to stand for hygiene.    Balance Overall balance assessment: Needs assistance Sitting-balance support: No upper extremity supported;Feet supported Sitting balance-Leahy Scale: Good     Standing balance support: Bilateral upper extremity supported Standing balance-Leahy Scale: Poor                             ADL either performed or assessed with  clinical judgement   ADL Overall ADL's : Needs assistance/impaired Eating/Feeding: Set up;Sitting   Grooming: Sitting;Wash/dry face;Wash/dry hands   Upper Body Bathing: Set up;Sitting   Lower Body Bathing: Maximal assistance;Sit to/from stand;Cueing for safety;Cueing for sequencing   Upper Body Dressing : Set up;Sitting   Lower Body Dressing: Maximal assistance;Sit to/from stand;Cueing for safety;Cueing for sequencing;Cueing for compensatory techniques       Toileting- Clothing Manipulation and Hygiene: Maximal assistance;Sit to/from stand;Cueing for compensatory techniques;Cueing for sequencing         General ADL Comments: pt aware she will need rehab acute rehab.  Pt verY agreeable     Vision Patient Visual Report: No change from baseline              Pertinent Vitals/Pain Pain Assessment: 0-10 Pain Score: 4  Pain Location: R knee Pain Descriptors / Indicators: Aching;Grimacing;Guarding;Sore Pain Intervention(s): Limited activity within patient's tolerance;Repositioned     Hand Dominance     Extremity/Trunk Assessment Upper Extremity Assessment Upper Extremity Assessment: Generalized weakness           Communication Communication Communication: No difficulties   Cognition Arousal/Alertness: Awake/alert Behavior During Therapy: WFL for tasks assessed/performed Overall Cognitive Status: Within Functional Limits for tasks assessed  Home Living Family/patient expects to be discharged to:: Private residence Living Arrangements: Spouse/significant other Available Help at Discharge: Family Type of Home: House Home Access: Stairs to enter Secretary/administrator of Steps: 2 Entrance Stairs-Rails: Right Home Layout: One level         Bathroom Toilet: Standard     Home Equipment: Environmental consultant - 2 wheels;Cane - single point          Prior Functioning/Environment Level of Independence:  Independent        Comments: IND prior to this TKR        OT Problem List: Decreased strength;Decreased activity tolerance;Decreased knowledge of use of DME or AE;Impaired balance (sitting and/or standing)      OT Treatment/Interventions: Self-care/ADL training;Patient/family education;DME and/or AE instruction;Therapeutic activities    OT Goals(Current goals can be found in the care plan section) Acute Rehab OT Goals Patient Stated Goal: Regain IND OT Goal Formulation: With patient Time For Goal Achievement: 06/17/20 Potential to Achieve Goals: Good ADL Goals Pt Will Perform Grooming: with supervision;sitting Pt Will Perform Lower Body Bathing: sit to/from stand;with min assist Pt Will Perform Lower Body Dressing: with min assist;sit to/from stand Pt Will Transfer to Toilet: with supervision;bedside commode Pt Will Perform Toileting - Clothing Manipulation and hygiene: with min assist;sit to/from stand  OT Frequency: Min 2X/week   Barriers to D/C:               AM-PAC OT "6 Clicks" Daily Activity     Outcome Measure Help from another person eating meals?: None Help from another person taking care of personal grooming?: A Little Help from another person toileting, which includes using toliet, bedpan, or urinal?: A Lot Help from another person bathing (including washing, rinsing, drying)?: A Little Help from another person to put on and taking off regular upper body clothing?: A Little Help from another person to put on and taking off regular lower body clothing?: A Lot 6 Click Score: 17   End of Session Equipment Utilized During Treatment: Rolling walker Nurse Communication: Mobility status  Activity Tolerance: Patient tolerated treatment well Patient left: in chair;with call bell/phone within reach  OT Visit Diagnosis: Unsteadiness on feet (R26.81);Other abnormalities of gait and mobility (R26.89);Muscle weakness (generalized) (M62.81);History of falling (Z91.81)                 Time: 3536-1443 OT Time Calculation (min): 24 min Charges:  OT General Charges $OT Visit: 1 Visit OT Evaluation $OT Eval Moderate Complexity: 1 Mod  Lise Auer, OT Acute Rehabilitation Services [phone] Office- [phone]     Rickelle Sylvestre, Karin Golden D 06/03/2020, 6:15 PM

## 2020-06-04 DIAGNOSIS — Z96651 Presence of right artificial knee joint: Secondary | ICD-10-CM | POA: Diagnosis present

## 2020-06-04 DIAGNOSIS — M25561 Pain in right knee: Secondary | ICD-10-CM | POA: Diagnosis present

## 2020-06-04 DIAGNOSIS — E860 Dehydration: Secondary | ICD-10-CM | POA: Diagnosis present

## 2020-06-04 DIAGNOSIS — M1990 Unspecified osteoarthritis, unspecified site: Secondary | ICD-10-CM | POA: Diagnosis present

## 2020-06-04 DIAGNOSIS — R55 Syncope and collapse: Secondary | ICD-10-CM | POA: Diagnosis present

## 2020-06-04 DIAGNOSIS — E669 Obesity, unspecified: Secondary | ICD-10-CM | POA: Diagnosis present

## 2020-06-04 DIAGNOSIS — Z87891 Personal history of nicotine dependence: Secondary | ICD-10-CM | POA: Diagnosis not present

## 2020-06-04 DIAGNOSIS — W19XXXD Unspecified fall, subsequent encounter: Secondary | ICD-10-CM | POA: Diagnosis not present

## 2020-06-04 DIAGNOSIS — N393 Stress incontinence (female) (male): Secondary | ICD-10-CM | POA: Diagnosis present

## 2020-06-04 DIAGNOSIS — I10 Essential (primary) hypertension: Secondary | ICD-10-CM | POA: Diagnosis present

## 2020-06-04 DIAGNOSIS — Z20822 Contact with and (suspected) exposure to covid-19: Secondary | ICD-10-CM | POA: Diagnosis present

## 2020-06-04 DIAGNOSIS — Z79899 Other long term (current) drug therapy: Secondary | ICD-10-CM | POA: Diagnosis not present

## 2020-06-04 DIAGNOSIS — M25461 Effusion, right knee: Secondary | ICD-10-CM | POA: Diagnosis present

## 2020-06-04 DIAGNOSIS — F419 Anxiety disorder, unspecified: Secondary | ICD-10-CM | POA: Diagnosis present

## 2020-06-04 DIAGNOSIS — Z79891 Long term (current) use of opiate analgesic: Secondary | ICD-10-CM | POA: Diagnosis not present

## 2020-06-04 DIAGNOSIS — M797 Fibromyalgia: Secondary | ICD-10-CM | POA: Diagnosis present

## 2020-06-04 DIAGNOSIS — Z90710 Acquired absence of both cervix and uterus: Secondary | ICD-10-CM | POA: Diagnosis not present

## 2020-06-04 LAB — CBC
HCT: 27.4 % — ABNORMAL LOW (ref 36.0–46.0)
Hemoglobin: 8.5 g/dL — ABNORMAL LOW (ref 12.0–15.0)
MCH: 33.5 pg (ref 26.0–34.0)
MCHC: 31 g/dL (ref 30.0–36.0)
MCV: 107.9 fL — ABNORMAL HIGH (ref 80.0–100.0)
Platelets: 219 10*3/uL (ref 150–400)
RBC: 2.54 MIL/uL — ABNORMAL LOW (ref 3.87–5.11)
RDW: 14.4 % (ref 11.5–15.5)
WBC: 6.9 10*3/uL (ref 4.0–10.5)
nRBC: 0.3 % — ABNORMAL HIGH (ref 0.0–0.2)

## 2020-06-04 MED ORDER — ESTRADIOL 0.1 MG/GM VA CREA
2.0000 g | TOPICAL_CREAM | VAGINAL | Status: DC
  Filled 2020-06-04: qty 42.5

## 2020-06-04 MED ORDER — HYDROCHLOROTHIAZIDE 12.5 MG PO TABS: 12.5000 mg | ORAL_TABLET | Freq: Every day | ORAL | Status: AC

## 2020-06-04 MED ORDER — LOSARTAN POTASSIUM 25 MG PO TABS: 25.0000 mg | ORAL_TABLET | Freq: Every day | ORAL | Status: AC

## 2020-06-04 MED ORDER — ACETAMINOPHEN-CODEINE #3 300-30 MG PO TABS: 1.0000 | ORAL_TABLET | Freq: Three times a day (TID) | ORAL | 0 refills | Status: AC | PRN

## 2020-06-04 NOTE — TOC Progression Note (Addendum)
Transition of Care [facility]) - Progression Note    Patient Details  Name: Kayla Logan MRN: [id number] Date of Birth: Dec 11, 1940  [facility] CM/SW Contact  Darleene Cleaver, [place] Phone Number: 06/04/2020, 3:08 PM  Clinical Narrative:     CSW was informed that patient wants SNF placement, CSW presented patient with choices, and she chose [facility].  CSW contacted [facility] they can accept once Berkley Harvey is received.  CSW updated patient that insurance will have to approve, CSW also informed patient that if insurance does not approve patient for SNF, then she will have to go home with home health.  Patient is already set up with [facility].  Patient expressed understanding, CSW awaiting for insurance approval reference number is [id number].   Expected Discharge Plan: Skilled Nursing Facility Barriers to Discharge: SNF Pending bed offer, Insurance Authorization  Expected Discharge Plan and Services Expected Discharge Plan: Skilled Nursing Facility In-house Referral: Clinical Social Work     Living arrangements for the past 2 months: Single Family Home                                       Social Determinants of Health (SDOH) Interventions    Readmission Risk Interventions No flowsheet data found.

## 2020-06-04 NOTE — Progress Notes (Signed)
Physical Therapy Treatment Patient Details Name: Kayla Logan MRN: [id number] DOB: 1941/03/11 Today's Date: 06/04/2020    History of Present Illness Pt s/p R TKR 05/31/20 as day surgery, discharged home. Had a fall 06/01/20 at home.  No fx per imaging but significant bruising.    PT Comments    Progressing slowly with mobility. Mobility remains limited by pain, fatigue and weakness. Continue to recommend ST rehab at this time. Pt has not demonstrated ability to safely manage at home alone. She stated she would not have any help at home during the day, only at night. Will continue to progress activity as tolerated. If SNF is not an option for some reason, recommend HHPT and 24 hour supervision/assist.     Follow Up Recommendations  SNF     Equipment Recommendations  3in1 (PT)    Recommendations for Other Services       Precautions / Restrictions Precautions Precautions: Fall;Knee Required Braces or Orthoses: Knee Immobilizer - Right Knee Immobilizer - Right: Discontinue once straight leg raise with < 10 degree lag Restrictions Weight Bearing Restrictions: No Other Position/Activity Restrictions: WBAT    Mobility  Bed Mobility               General bed mobility comments: pt in chair  Transfers Overall transfer level: Needs assistance Equipment used: Rolling walker (2 wheeled) Transfers: Sit to/from Stand Sit to Stand: Min assist         General transfer comment: VCs safety, technqiue, hand/LE placement. Increased time.  Ambulation/Gait Ambulation/Gait assistance: Min assist Gait Distance (Feet): 25 Feet Assistive device: Rolling walker (2 wheeled) Gait Pattern/deviations: Step-to pattern;Decreased step length - right;Decreased step length - left;Shuffle;Trunk flexed     General Gait Details: VC safety, technique, sequence. Slow gait speed. Fatigues easily. Distance limited by pain, fatigue, weakness. Followed with recliner and used it to transport pt back  to room.   Stairs             Wheelchair Mobility    Modified Rankin (Stroke Patients Only)       Balance Overall balance assessment: Needs assistance;History of Falls         Standing balance support: Bilateral upper extremity supported Standing balance-Leahy Scale: Poor                              Cognition Arousal/Alertness: Awake/alert Behavior During Therapy: WFL for tasks assessed/performed Overall Cognitive Status: Within Functional Limits for tasks assessed                                        Exercises Total Joint Exercises Ankle Circles/Pumps: AROM;Both;10 reps Quad Sets: AROM;Both;10 reps Heel Slides: AAROM;Right;10 reps Hip ABduction/ADduction: AAROM;Right;10 reps Straight Leg Raises: AAROM;Right;10 reps Goniometric ROM: ~10-50 degrees    General Comments        Pertinent Vitals/Pain Pain Assessment: 0-10 Pain Score: 7  Pain Location: R knee Pain Descriptors / Indicators: Aching;Grimacing;Guarding;Sore Pain Intervention(s): Limited activity within patient's tolerance;Monitored during session;Repositioned    Home Living                      Prior Function            PT Goals (current goals can now be found in the care plan section) Progress towards PT goals: Progressing toward goals  Frequency    7X/week      PT Plan Current plan remains appropriate    Co-evaluation              AM-PAC PT "6 Clicks" Mobility   Outcome Measure  Help needed turning from your back to your side while in a flat bed without using bedrails?: A Little Help needed moving from lying on your back to sitting on the side of a flat bed without using bedrails?: A Little Help needed moving to and from a bed to a chair (including a wheelchair)?: A Little Help needed standing up from a chair using your arms (e.g., wheelchair or bedside chair)?: A Little Help needed to walk in hospital room?: A Little Help  needed climbing 3-5 steps with a railing? : Total 6 Click Score: 16    End of Session Equipment Utilized During Treatment: Gait belt;Right knee immobilizer Activity Tolerance: Patient limited by fatigue;Patient limited by pain Patient left: in chair;with call bell/phone within reach;with chair alarm set   PT Visit Diagnosis: Difficulty in walking, not elsewhere classified (R26.2);Pain Pain - Right/Left: Right Pain - part of body: Knee     Time: 4492-0100 PT Time Calculation (min) (ACUTE ONLY): 27 min  Charges:  $Gait Training: 8-22 mins $Therapeutic Exercise: 8-22 mins                        Faye Ramsay, PT Acute Rehabilitation  Office: [phone] Pager: [phone]

## 2020-06-04 NOTE — Discharge Summary (Addendum)
Physician Discharge Summary  JANITZA REVUELTA FFM:[id number] DOB: [birth year] DOA: 06/01/2020  PCP: Johny Blamer, MD  Admit date: 06/01/2020 Discharge date: 06/05/2020  Time spent: 35 minutes  Recommendations for Outpatient Follow-up:  Orthopedics Dr. 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 Fibromyalgia   Benign essential HTN   Osteoarthrosis, localized, primary, knee, right   Discharge Condition: Stable  Diet recommendation: Low-sodium  Filed Weights   06/01/20 2114  Weight: 89.7 kg    History of present illness:  Corleen Otwell Ruthis a 79 y.o.femalewith medical history of hypertension, obesity, osteoarthritis underwent right total knee replacement 2 days prior, reports that postop day 1 she was unable to get out of bed, hence does not remember eating or drinking very much, the following morning she woke up tried to stand up and next thing she remembers is waking up on the floor. -She denies any chest pain, shortness of breath, palpitations or dizziness -Presented to the emergency room, seen by orthopedics recommended observation and therapy  Hospital Course:   Fall, suspected syncope -She will need replacement on 7/22, was unable to move much after going home and does not remember eating or drinking very much, and in the next morning tried to stand up and subsequently found herself on the floor, she thinks she may have very briefly lost consciousness,, unable to stand up or move after this, brought to the ER by EMS -Work-up in the ED was unremarkable,  EKG with Q waves in inferior leads, 2D echocardiogram was unremarkable -no events on telemetry -Suspect could have been secondary to dehydration, and recent surgery -Orthostatics not done initially, hydrated with NS -fluids discontinued, ARB and HCTZ held  initially and then resumed at a lower dose -PT OT eval completed, now SNF recommended for short-term rehab  Recent right total knee arthroplasty 7/21 -Discharged from day surgery on day of knee replacement,  -admitted with fall, Knee swelling -Ortho consulted, recommended PT -Physical therapy consulted, now SNF recommended for short-term rehab -[facility] -on eliquis for DVT prophylaxis, continue this for 4 weeks   Benign essential HTN -off IVF, resume ARb and HCTZ at much lower dose   H/o fibromyalgia   Obesity  Consultants:   Orthopedics Dr. Luiz Blare   Discharge Exam: Vitals:   06/03/20 2100 06/04/20 0623  BP: (!) 138/71 (!) 129/68  Pulse: 71 64  Resp: 20 20  Temp: 98.6 F (37 C) 98.1 F (36.7 C)  SpO2: 98% 98%    General: AAOx3 Cardiovascular: S1S2/RRR Respiratory: CTAB  Discharge Instructions    Allergies as of 06/04/2020      Reactions   Epinephrine Other (See Comments)   SEVERE MIGRAINE   Demerol [meperidine] Rash   Latex Rash      Medication List    STOP taking these medications   Colace 100 MG capsule Generic drug: docusate sodium   losartan-hydrochlorothiazide 100-25 MG tablet Commonly known as: HYZAAR   methylPREDNISolone 2 MG tablet Commonly known as: MEDROL   methylPREDNISolone 4 MG tablet Commonly known as: MEDROL   Percocet 5-325 MG tablet Generic drug: oxyCODONE-acetaminophen   Premarin vaginal cream Generic drug: conjugated estrogens   tiZANidine 2 MG tablet Commonly known as: ZANAFLEX     TAKE these medications   acetaminophen 500 MG tablet Commonly known as: TYLENOL Take 1,000 mg by mouth in the morning  and at bedtime.   acetaminophen-codeine 300-30 MG tablet Commonly known as: TYLENOL #3 Take 1 tablet by mouth every 8 (eight) hours as needed for moderate pain or severe pain. What changed: when to take this   CALCIUM + D PO Take 1 tablet by mouth daily.   citalopram 10 MG tablet Commonly  known as: CELEXA Take 10 mg by mouth daily.   cyclobenzaprine 5 MG tablet Commonly known as: FLEXERIL Take 5 mg by mouth 3 (three) times daily as needed for muscle spasms.   Eliquis 2.5 MG Tabs tablet Generic drug: apixaban Take 2.5 mg by mouth 2 (two) times daily.   estradiol 0.1 MG/GM vaginal cream Commonly known as: ESTRACE Place 2 g vaginally 2 (two) times a week. Mondays and fridays   GLUCOSAMINE-CHONDROITIN PO Take 2 tablets by mouth daily.   hydrochlorothiazide 12.5 MG tablet Commonly known as: HYDRODIURIL Take 1 tablet (12.5 mg total) by mouth daily. What changed:   medication strength  how much to take   losartan 25 MG tablet Commonly known as: COZAAR Take 1 tablet (25 mg total) by mouth daily. What changed:   medication strength  how much to take   propranolol 20 MG tablet Commonly known as: INDERAL Take 20 mg by mouth 3 (three) times daily.   RED YEAST RICE PO Take 2 tablets by mouth daily.   traMADol 50 MG tablet Commonly known as: ULTRAM Take 50 mg by mouth every 6 (six) hours as needed for moderate pain or severe pain.      Allergies  Allergen Reactions  . Epinephrine Other (See Comments)    SEVERE MIGRAINE  . Demerol [Meperidine] Rash  . Latex Rash      The results of significant diagnostics from this hospitalization (including imaging, microbiology, ancillary and laboratory) are listed below for reference.    Significant Diagnostic Studies: CT Head Wo Contrast  Result Date: 06/01/2020 CLINICAL DATA:  Head trauma, minor, normal mental status. EXAM: CT HEAD WITHOUT CONTRAST TECHNIQUE: Contiguous axial images were obtained from the base of the skull through the vertex without intravenous contrast. COMPARISON:  No pertinent prior studies available for comparison. FINDINGS: Brain: Motion degraded examination. Mild generalized parenchymal atrophy. Moderate patchy hypoattenuation within the cerebral white matter is nonspecific, but consistent  with chronic small vessel ischemic disease. There is no acute intracranial hemorrhage. No demarcated cortical infarct. No extra-axial fluid collection. No evidence of intracranial mass. No midline shift. Vascular: No hyperdense vessel. Skull: Normal. Negative for fracture or focal lesion. Sinuses/Orbits: Visualized orbits show no acute finding. No significant paranasal sinus disease or mastoid effusion at the imaged levels. IMPRESSION: Motion degraded examination. No evidence of acute intracranial abnormality. Mild generalized parenchymal atrophy with moderate chronic small vessel ischemic disease. Electronically Signed   By: Jackey Loge DO   On: 06/01/2020 14:50   DG Knee Complete 4 Views Right  Result Date: 06/01/2020 CLINICAL DATA:  Right knee pain following a fall. Status post right knee replacement two days ago. EXAM: RIGHT KNEE - COMPLETE 4+ VIEW COMPARISON:  08/05/2012. FINDINGS: Interval right total knee prosthesis in satisfactory position and alignment. No fracture or dislocation. No effusion. IMPRESSION: No fracture or dislocation. Electronically Signed   By: Beckie Salts M.D.   On: 06/01/2020 14:57   ECHOCARDIOGRAM COMPLETE  Result Date: 06/02/2020    ECHOCARDIOGRAM REPORT   Patient Name:   ERCILIA BETTINGER Date of Exam: 06/02/2020 Medical Rec #:  [id number]       Height:  65.5 in Accession #:    [id number]      Weight:       197.8 lb Date of Birth:  [birth year]       BSA:          1.980 m Patient Age:    79 years        BP:           132/68 mmHg Patient Gender: F               HR:           66 bpm. Exam Location:  Inpatient Procedure: 2D Echo, Cardiac Doppler and Color Doppler Indications:    R55 Syncope  History:        Patient has no prior history of Echocardiogram examinations.  Sonographer:    Roosvelt Maser RDCS Referring Phys: [phone] Kristina Mcnorton IMPRESSIONS  1. Left ventricular ejection fraction, by estimation, is 60 to 65%. The left ventricle has normal function. The left ventricle has no  regional wall motion abnormalities. Left ventricular diastolic parameters are indeterminate.  2. Right ventricular systolic function is normal. The right ventricular size is normal. There is mildly elevated pulmonary artery systolic pressure.  3. Left atrial size was mild to moderately dilated.  4. The mitral valve is normal in structure. Trivial mitral valve regurgitation.  5. The aortic valve is tricuspid. Aortic valve regurgitation is not visualized.  6. The inferior vena cava is normal in size with greater than 50% respiratory variability, suggesting right atrial pressure of 3 mmHg. Comparison(s): No prior Echocardiogram. Conclusion(s)/Recommendation(s): Normal biventricular function without evidence of hemodynamically significant valvular heart disease. FINDINGS  Left Ventricle: Left ventricular ejection fraction, by estimation, is 60 to 65%. The left ventricle has normal function. The left ventricle has no regional wall motion abnormalities. The left ventricular internal cavity size was normal in size. There is  no left ventricular hypertrophy. Left ventricular diastolic parameters are indeterminate. Right Ventricle: The right ventricular size is normal. No increase in right ventricular wall thickness. Right ventricular systolic function is normal. There is mildly elevated pulmonary artery systolic pressure. The tricuspid regurgitant velocity is 2.93  m/s, and with an assumed right atrial pressure of 3 mmHg, the estimated right ventricular systolic pressure is 37.3 mmHg. Left Atrium: Left atrial size was mild to moderately dilated. Right Atrium: Right atrial size was normal in size. Pericardium: Trivial pericardial effusion is present. Presence of pericardial fat pad. Mitral Valve: The mitral valve is normal in structure. Trivial mitral valve regurgitation. Tricuspid Valve: The tricuspid valve is normal in structure. Tricuspid valve regurgitation is mild. Aortic Valve: The aortic valve is tricuspid. Aortic  valve regurgitation is not visualized. Pulmonic Valve: The pulmonic valve was not well visualized. Pulmonic valve regurgitation is trivial. Aorta: The aortic root and ascending aorta are structurally normal, with no evidence of dilitation. Venous: The inferior vena cava is normal in size with greater than 50% respiratory variability, suggesting right atrial pressure of 3 mmHg. IAS/Shunts: No atrial level shunt detected by color flow Doppler.  LEFT VENTRICLE PLAX 2D LVIDd:         5.00 cm     Diastology LVIDs:         3.30 cm     LV e' lateral:   9.03 cm/s LV PW:         1.00 cm     LV E/e' lateral: 11.4 LV IVS:        1.00 cm  LV e' medial:    8.59 cm/s LVOT diam:     1.90 cm     LV E/e' medial:  12.0 LV SV:         71 LV SV Index:   36 LVOT Area:     2.84 cm  LV Volumes (MOD) LV vol d, MOD A2C: 96.7 ml LV vol d, MOD A4C: 95.6 ml LV vol s, MOD A2C: 28.1 ml LV vol s, MOD A4C: 27.9 ml LV SV MOD A2C:     68.6 ml LV SV MOD A4C:     95.6 ml LV SV MOD BP:      68.1 ml RIGHT VENTRICLE RV Basal diam:  3.70 cm RV S prime:     17.10 cm/s TAPSE (M-mode): 3.4 cm LEFT ATRIUM           Index       RIGHT ATRIUM           Index LA diam:      3.70 cm 1.87 cm/m  RA Area:     17.20 cm LA Vol (A2C): 92.1 ml 46.52 ml/m RA Volume:   40.00 ml  20.20 ml/m LA Vol (A4C): 76.9 ml 38.84 ml/m  AORTIC VALVE LVOT Vmax:   117.00 cm/s LVOT Vmean:  70.300 cm/s LVOT VTI:    0.252 m  AORTA Ao Root diam: 3.40 cm Ao Asc diam:  3.30 cm MITRAL VALVE                TRICUSPID VALVE MV Area (PHT): 3.54 cm     TR Peak grad:   34.3 mmHg MV Decel Time: 214 msec     TR Vmax:        293.00 cm/s MV E velocity: 103.00 cm/s MV A velocity: 113.00 cm/s  SHUNTS MV E/A ratio:  0.91         Systemic VTI:  0.25 m                             Systemic Diam: 1.90 cm Jodelle Red MD Electronically signed by Jodelle Red MD Signature Date/Time: 06/02/2020/3:19:30 PM    Final     Microbiology: Recent Results (from the past 240 hour(s))  SARS  Coronavirus 2 by RT PCR (hospital order, performed in [facility] hospital lab) Nasopharyngeal Nasopharyngeal Swab     Status: None   Collection Time: 06/01/20  5:54 PM   Specimen: Nasopharyngeal Swab  Result Value Ref Range Status   SARS Coronavirus 2 NEGATIVE NEGATIVE Final    Comment: (NOTE) SARS-CoV-2 target nucleic acids are NOT DETECTED.  The SARS-CoV-2 RNA is generally detectable in upper and lower respiratory specimens during the acute phase of infection. The lowest concentration of SARS-CoV-2 viral copies this assay can detect is 250 copies / mL. A negative result does not preclude SARS-CoV-2 infection and should not be used as the sole basis for treatment or other patient management decisions.  A negative result may occur with improper specimen collection / handling, submission of specimen other than nasopharyngeal swab, presence of viral mutation(s) within the areas targeted by this assay, and inadequate number of viral copies (<250 copies / mL). A negative result must be combined with clinical observations, patient history, and epidemiological information.  Fact Sheet for Patients:   BoilerBrush.com.cy  Fact Sheet for Healthcare Providers: https://pope.com/  This test is not yet approved or  cleared by the [place] FDA and has been authorized  for detection and/or diagnosis of SARS-CoV-2 by FDA under an Emergency Use Authorization (EUA).  This EUA will remain in effect (meaning this test can be used) for the duration of the COVID-19 declaration under Section 564(b)(1) of the Act, 21 U.S.C. section 360bbb-3(b)(1), unless the authorization is terminated or revoked sooner.  Performed at [facility], [address] [place], [place] [address]      Labs: Basic Metabolic Panel: Recent Labs  Lab 06/01/20 1453 06/02/20 0748 06/03/20 0425  NA 141 141 141  K 4.0 3.8 4.1  CL 103 104 106  CO2 28 28  28   GLUCOSE 108* 112* 109*  BUN 36* 27* 24*  CREATININE 1.24* 1.11* 1.03*  CALCIUM 8.8* 8.6* 8.5*   Liver Function Tests: Recent Labs  Lab 06/02/20 0748  AST 16  ALT 12  ALKPHOS 37*  BILITOT 0.7  PROT 6.6  ALBUMIN 3.4*   No results for input(s): LIPASE, AMYLASE in the last 168 hours. No results for input(s): AMMONIA in the last 168 hours. CBC: Recent Labs  Lab 06/01/20 1453 06/02/20 0748 06/03/20 0425 06/04/20 0451  WBC 13.0* 10.0 7.7 6.9  HGB 9.5* 8.9* 8.3* 8.5*  HCT 30.1* 28.8* 26.4* 27.4*  MCV 106.0* 106.3* 106.9* 107.9*  PLT 216 201 185 219   Cardiac Enzymes: Recent Labs  Lab 06/01/20 1453  CKTOTAL 117   BNP: BNP (last 3 results) No results for input(s): BNP in the last 8760 hours.  ProBNP (last 3 results) No results for input(s): PROBNP in the last 8760 hours.  CBG: No results for input(s): GLUCAP in the last 168 hours.  Signed:  Zannie CovePreetha Demontrez Rindfleisch MD.  [facility] 06/04/2020, 1:41 PM

## 2020-06-04 NOTE — Care Plan (Signed)
Patient is part of ortho bundle. She was home with SO and HHPT prior to this hospital visit. HHPT referral is with [facility]. SHe has all equipment at home.   Please contact Shauna Hugh, RNCM for discharge plan    Shauna Hugh, [facility]  [phone]

## 2020-06-05 DIAGNOSIS — W19XXXD Unspecified fall, subsequent encounter: Secondary | ICD-10-CM | POA: Diagnosis not present

## 2020-06-05 DIAGNOSIS — I10 Essential (primary) hypertension: Secondary | ICD-10-CM | POA: Diagnosis not present

## 2020-06-05 LAB — SARS CORONAVIRUS 2 BY RT PCR (HOSPITAL ORDER, PERFORMED IN ~~LOC~~ HOSPITAL LAB): SARS Coronavirus 2: NEGATIVE

## 2020-06-05 NOTE — Progress Notes (Signed)
Physical Therapy Treatment Patient Details Name: Kayla Logan MRN: [id number] DOB: September 24, 1941 Today's Date: 06/05/2020    History of Present Illness Pt s/p R TKR 05/31/20 as day surgery, discharged home. Had a fall 06/01/20 at home.  No fx per imaging but significant bruising.    PT Comments    Pt is progressing. Able to walk ~65 feet on today. She required assistance for bed mobility and for dressing LEs. Moderate pain with activity. Plan is for d/c to SNF later today. Feel pt will benefit greatly and regain independence with ST rehab stay.     Follow Up Recommendations  SNF     Equipment Recommendations   (TBD at next venue)    Recommendations for Other Services       Precautions / Restrictions Precautions Precautions: Fall;Knee Required Braces or Orthoses: Knee Immobilizer - Right Knee Immobilizer - Right: Discontinue once straight leg raise with < 10 degree lag Restrictions Weight Bearing Restrictions: No Other Position/Activity Restrictions: WBAT    Mobility  Bed Mobility Overal bed mobility: Needs Assistance         Sit to supine: Min assist   General bed mobility comments: Assist for R LE onto bed.  Transfers Overall transfer level: Needs assistance Equipment used: Rolling walker (2 wheeled) Transfers: Sit to/from Stand Sit to Stand: Min guard         General transfer comment: VCs safety, technqiue, hand/LE placement. Increased time.  Ambulation/Gait Ambulation/Gait assistance: Min guard Gait Distance (Feet): 65 Feet Assistive device: Rolling walker (2 wheeled) Gait Pattern/deviations: Step-to pattern;Decreased step length - right;Decreased step length - left;Shuffle;Trunk flexed;Step-through pattern;Decreased stride length     General Gait Details: VC safety, technique, sequence. Slow gait speed. Followed with recliner and used it to transport pt back to room.   Stairs             Wheelchair Mobility    Modified Rankin (Stroke  Patients Only)       Balance Overall balance assessment: Needs assistance;History of Falls         Standing balance support: Bilateral upper extremity supported Standing balance-Leahy Scale: Poor                              Cognition Arousal/Alertness: Awake/alert Behavior During Therapy: WFL for tasks assessed/performed Overall Cognitive Status: Within Functional Limits for tasks assessed                                        Exercises Total Joint Exercises Quad Sets: AROM;Both;10 reps Straight Leg Raises: AAROM;Right;10 reps Goniometric ROM: ~10-65 degrees    General Comments        Pertinent Vitals/Pain Pain Assessment: 0-10 Pain Score: 6  Pain Location: R knee Pain Descriptors / Indicators: Aching;Grimacing;Guarding;Sore Pain Intervention(s): Monitored during session;Limited activity within patient's tolerance;Repositioned;Ice applied    Home Living                      Prior Function            PT Goals (current goals can now be found in the care plan section) Progress towards PT goals: Progressing toward goals    Frequency    7X/week      PT Plan Current plan remains appropriate    Co-evaluation  AM-PAC PT "6 Clicks" Mobility   Outcome Measure  Help needed turning from your back to your side while in a flat bed without using bedrails?: A Little Help needed moving from lying on your back to sitting on the side of a flat bed without using bedrails?: A Little Help needed moving to and from a bed to a chair (including a wheelchair)?: A Little Help needed standing up from a chair using your arms (e.g., wheelchair or bedside chair)?: A Little Help needed to walk in hospital room?: A Little Help needed climbing 3-5 steps with a railing? : A Lot 6 Click Score: 17    End of Session Equipment Utilized During Treatment: Gait belt Activity Tolerance: Patient tolerated treatment well Patient  left: in bed;with call bell/phone within reach   PT Visit Diagnosis: Difficulty in walking, not elsewhere classified (R26.2);Pain Pain - Right/Left: Right Pain - part of body: Knee     Time: 0938-1829 PT Time Calculation (min) (ACUTE ONLY): 26 min  Charges:  $Gait Training: 8-22 mins $Therapeutic Exercise: 8-22 mins                        Faye Ramsay, PT Acute Rehabilitation  Office: [phone] Pager: [phone]

## 2020-06-05 NOTE — TOC Transition Note (Addendum)
Transition of Care [facility]) - CM/SW Discharge Note   Patient Details  Name: JENICA COSTILOW MRN: [id number] Date of Birth: 1941-07-28  [facility] CM/SW Contact:  Darleene Cleaver, LCSW Phone Number: 06/05/2020, 10:48 AM   Clinical Narrative:     CSW received phone call from patient's insurance company that she has been approved for SNF placement at [facility].  [facility] number is [phone], CSW updated SNF and patient.  Patient to be d/c'ed today to [facility] room 103.  Patient and family agreeable to plans will transport via ems RN to call report to [phone].    Patient to update her family members.  CSW updated [facility], and attempted to update case manager Shauna Hugh [phone], however message said voice mail not set up.  CSW updated physician to update discharge summary once Covid test comes back negative again.  Patient stated she has received the Moderna Vaccines in February and March.   Final next level of care: Skilled Nursing Facility Barriers to Discharge: Barriers Resolved   Patient Goals and CMS Choice Patient states their goals for this hospitalization and ongoing recovery are:: To go to SNF for rehab, then return back home with home health. CMS Medicare.gov Compare Post Acute Care list provided to:: Patient Choice offered to / list presented to : Patient  Discharge Placement PASRR number recieved: 06/04/20            Patient chooses bed at: [facility] Patient to be transferred to facility by: [facility] Name of family member notified: Patient to notify her family member Patient and family notified of of transfer: 06/05/20  Discharge Plan and Services In-house Referral: Clinical Social Work                DME Agency: NA       HH Arranged: NA          Social Determinants of Health (SDOH) Interventions     Readmission Risk Interventions No flowsheet data found.

## 2020-06-05 NOTE — Progress Notes (Signed)
Report called to Morrie Sheldon [phone] at [facility], Rm 103. SRP, RN

## 2020-11-16 DIAGNOSIS — Z1211 Encounter for screening for malignant neoplasm of colon: Secondary | ICD-10-CM | POA: Diagnosis not present

## 2021-01-22 DIAGNOSIS — H43813 Vitreous degeneration, bilateral: Secondary | ICD-10-CM | POA: Diagnosis not present

## 2021-01-22 DIAGNOSIS — Z961 Presence of intraocular lens: Secondary | ICD-10-CM | POA: Diagnosis not present

## 2021-02-13 DIAGNOSIS — M150 Primary generalized (osteo)arthritis: Secondary | ICD-10-CM | POA: Diagnosis not present

## 2021-02-13 DIAGNOSIS — M791 Myalgia, unspecified site: Secondary | ICD-10-CM | POA: Diagnosis not present

## 2021-02-13 DIAGNOSIS — M2550 Pain in unspecified joint: Secondary | ICD-10-CM | POA: Diagnosis not present

## 2021-02-13 DIAGNOSIS — Z7952 Long term (current) use of systemic steroids: Secondary | ICD-10-CM | POA: Diagnosis not present

## 2021-02-13 DIAGNOSIS — M353 Polymyalgia rheumatica: Secondary | ICD-10-CM | POA: Diagnosis not present

## 2021-02-13 DIAGNOSIS — H2000 Unspecified acute and subacute iridocyclitis: Secondary | ICD-10-CM | POA: Diagnosis not present

## 2021-04-30 DIAGNOSIS — E7800 Pure hypercholesterolemia, unspecified: Secondary | ICD-10-CM | POA: Diagnosis not present

## 2021-04-30 DIAGNOSIS — N183 Chronic kidney disease, stage 3 unspecified: Secondary | ICD-10-CM | POA: Diagnosis not present

## 2021-04-30 DIAGNOSIS — M353 Polymyalgia rheumatica: Secondary | ICD-10-CM | POA: Diagnosis not present

## 2021-04-30 DIAGNOSIS — M7062 Trochanteric bursitis, left hip: Secondary | ICD-10-CM | POA: Diagnosis not present

## 2021-04-30 DIAGNOSIS — I10 Essential (primary) hypertension: Secondary | ICD-10-CM | POA: Diagnosis not present

## 2021-04-30 DIAGNOSIS — R350 Frequency of micturition: Secondary | ICD-10-CM | POA: Diagnosis not present

## 2021-06-12 DIAGNOSIS — N8110 Cystocele, unspecified: Secondary | ICD-10-CM | POA: Diagnosis not present

## 2021-06-24 DIAGNOSIS — I10 Essential (primary) hypertension: Secondary | ICD-10-CM | POA: Diagnosis not present

## 2021-06-24 DIAGNOSIS — M1711 Unilateral primary osteoarthritis, right knee: Secondary | ICD-10-CM | POA: Diagnosis not present

## 2021-06-24 DIAGNOSIS — E7800 Pure hypercholesterolemia, unspecified: Secondary | ICD-10-CM | POA: Diagnosis not present

## 2021-06-24 DIAGNOSIS — N183 Chronic kidney disease, stage 3 unspecified: Secondary | ICD-10-CM | POA: Diagnosis not present

## 2021-07-31 DIAGNOSIS — Z87891 Personal history of nicotine dependence: Secondary | ICD-10-CM | POA: Diagnosis not present

## 2021-07-31 DIAGNOSIS — I10 Essential (primary) hypertension: Secondary | ICD-10-CM | POA: Diagnosis not present

## 2021-08-12 DIAGNOSIS — Z7952 Long term (current) use of systemic steroids: Secondary | ICD-10-CM | POA: Diagnosis not present

## 2021-08-12 DIAGNOSIS — N3020 Other chronic cystitis without hematuria: Secondary | ICD-10-CM | POA: Diagnosis not present

## 2021-08-12 DIAGNOSIS — M353 Polymyalgia rheumatica: Secondary | ICD-10-CM | POA: Diagnosis not present

## 2021-08-12 DIAGNOSIS — I10 Essential (primary) hypertension: Secondary | ICD-10-CM | POA: Diagnosis not present

## 2021-08-12 DIAGNOSIS — Z79899 Other long term (current) drug therapy: Secondary | ICD-10-CM | POA: Diagnosis not present

## 2021-09-25 DIAGNOSIS — R82998 Other abnormal findings in urine: Secondary | ICD-10-CM | POA: Diagnosis not present

## 2021-09-25 DIAGNOSIS — Z87448 Personal history of other diseases of urinary system: Secondary | ICD-10-CM | POA: Diagnosis not present

## 2021-09-25 DIAGNOSIS — R800 Isolated proteinuria: Secondary | ICD-10-CM | POA: Diagnosis not present

## 2021-10-22 DIAGNOSIS — M353 Polymyalgia rheumatica: Secondary | ICD-10-CM | POA: Diagnosis not present

## 2021-10-22 DIAGNOSIS — I10 Essential (primary) hypertension: Secondary | ICD-10-CM | POA: Diagnosis not present

## 2021-10-22 DIAGNOSIS — E7800 Pure hypercholesterolemia, unspecified: Secondary | ICD-10-CM | POA: Diagnosis not present

## 2021-10-22 DIAGNOSIS — N183 Chronic kidney disease, stage 3 unspecified: Secondary | ICD-10-CM | POA: Diagnosis not present

## 2021-10-22 DIAGNOSIS — Z0000 Encounter for general adult medical examination without abnormal findings: Secondary | ICD-10-CM | POA: Diagnosis not present

## 2021-10-22 DIAGNOSIS — M7062 Trochanteric bursitis, left hip: Secondary | ICD-10-CM | POA: Diagnosis not present

## 2021-10-24 ENCOUNTER — Other Ambulatory Visit: Payer: Self-pay | Admitting: Obstetrics and Gynecology

## 2021-10-24 DIAGNOSIS — Z1231 Encounter for screening mammogram for malignant neoplasm of breast: Secondary | ICD-10-CM

## 2021-11-21 ENCOUNTER — Ambulatory Visit
Admission: RE | Admit: 2021-11-21 | Discharge: 2021-11-21 | Disposition: A | Discharge status: Home / Self Care | Payer: Medicare Other | Source: Ambulatory Visit | Attending: Obstetrics and Gynecology | Admitting: Obstetrics and Gynecology

## 2021-11-21 DIAGNOSIS — Z1231 Encounter for screening mammogram for malignant neoplasm of breast: Secondary | ICD-10-CM

## 2021-11-26 IMAGING — CT CT HEAD W/O CM
3 of 6 series · 14 of 47 positions shown, 17 images · non-contrast
Comparison: No pertinent prior studies available for comparison.

CLINICAL DATA: Head trauma, minor, normal mental status.

EXAM:
CT HEAD WITHOUT CONTRAST
TECHNIQUE: Contiguous axial images were obtained from the base of the skull
through the vertex without intravenous contrast.

[Series 2: head wo · axial · 0.47mm/px · z∈[-93,+27]mm · 9 of 30 slices shown, 12 images]
[im 4/30  brain]
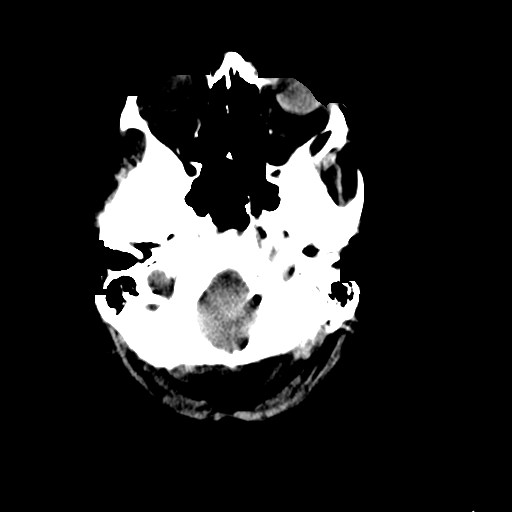
[im 4/30  bone]
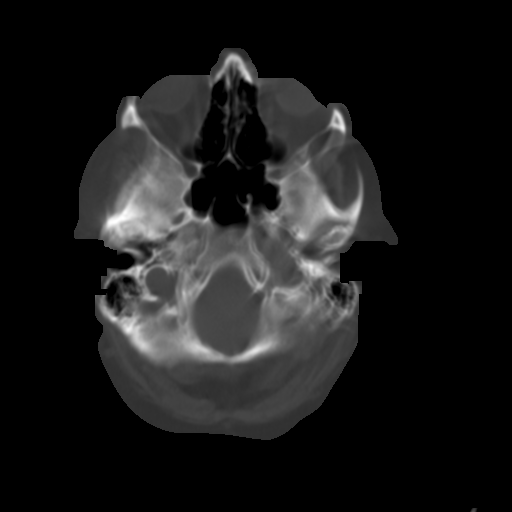
[im 7/30  brain]
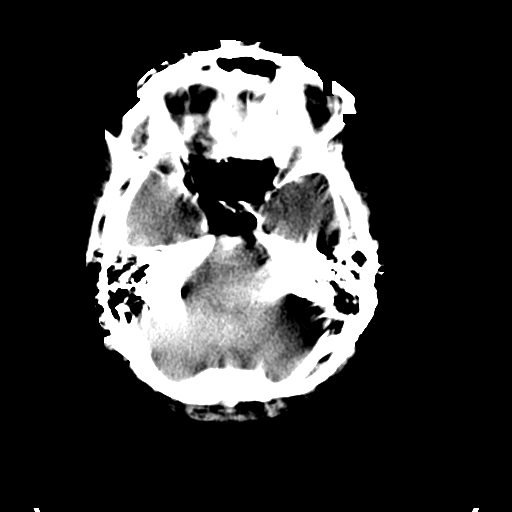
[im 10/30  brain]
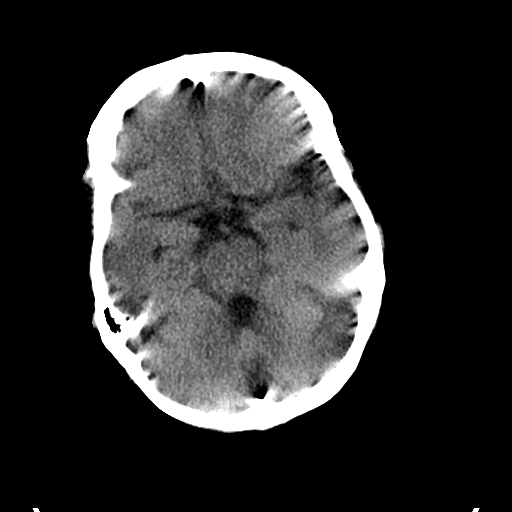
[im 13/30  brain]
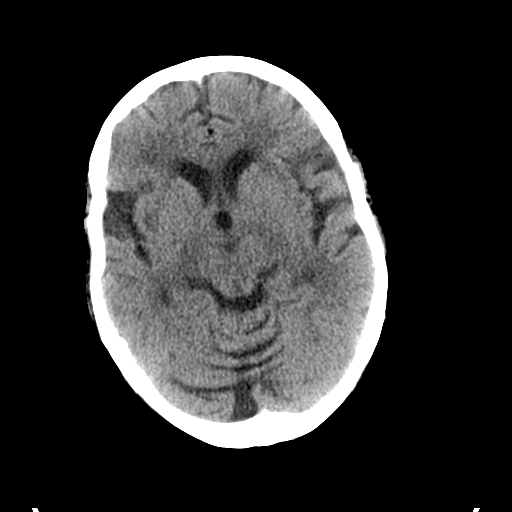
[im 16/30  brain]
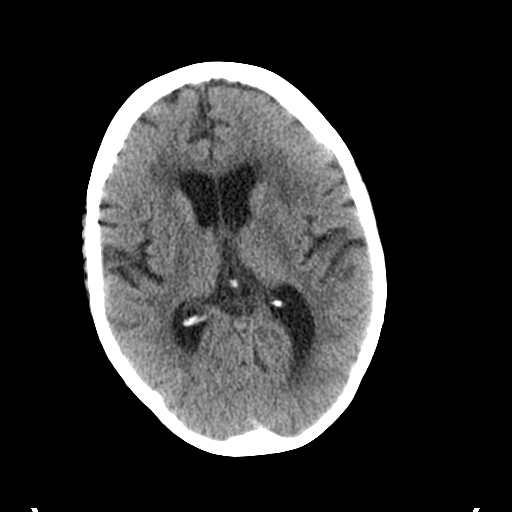
[im 16/30  bone]
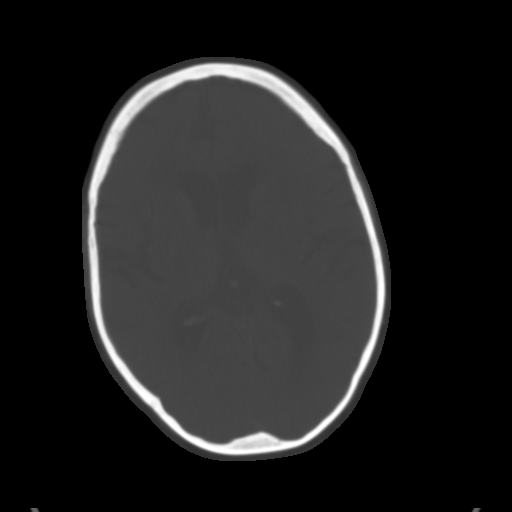
[im 19/30  brain]
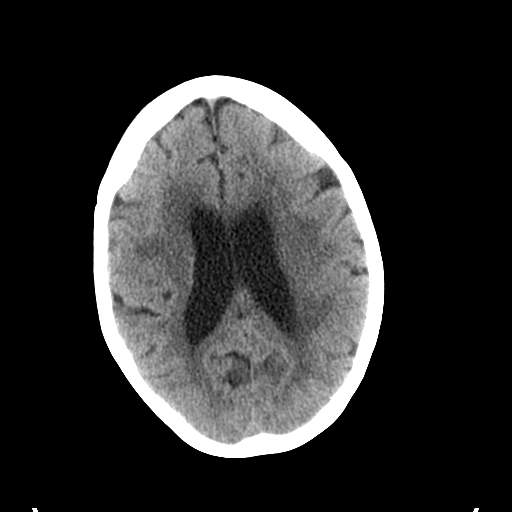
[im 22/30  brain]
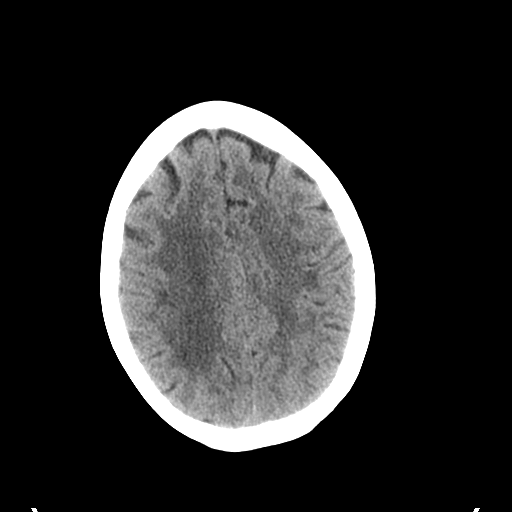
[im 25/30  brain]
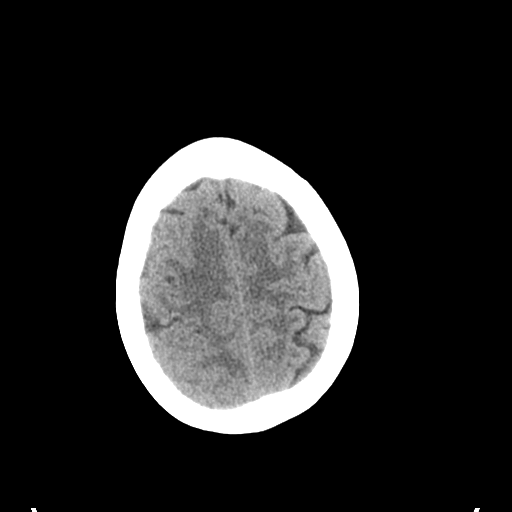
[im 28/30  brain]
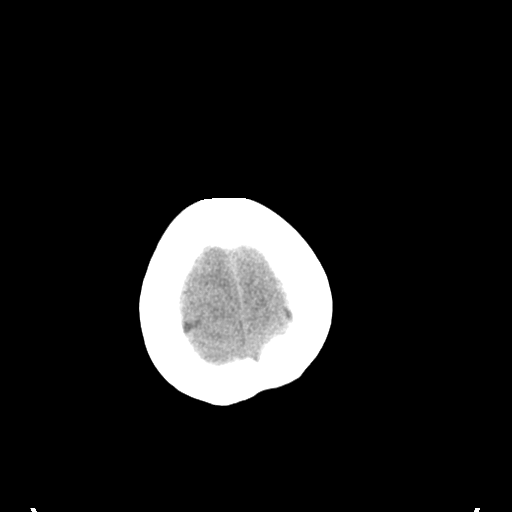
[im 28/30  bone]
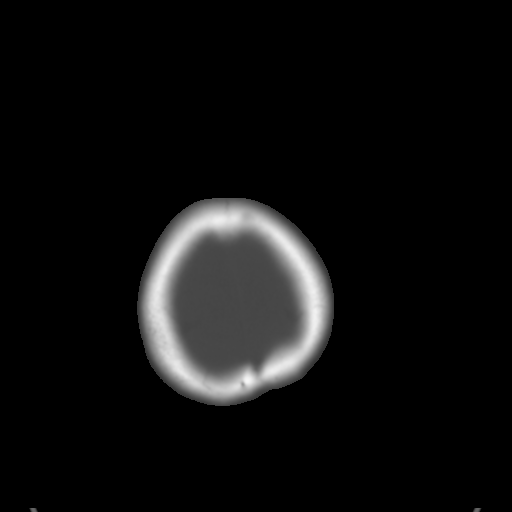

[Series 6: coronal soft tissue · coronal · 0.36mm/px · 3 of 66 slices shown]
[im 17/66  brain]
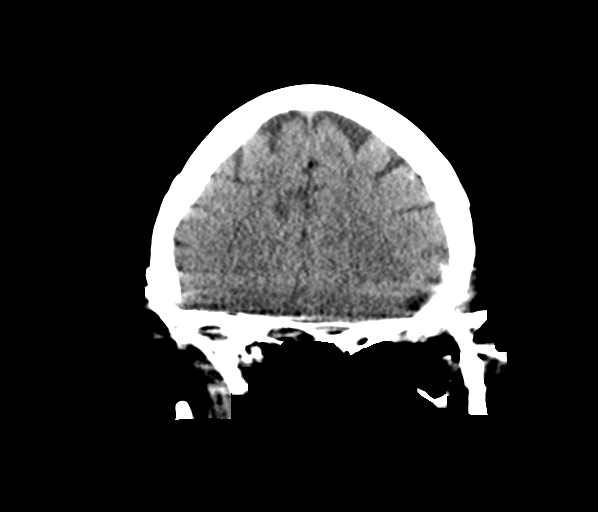
[im 33/66  brain]
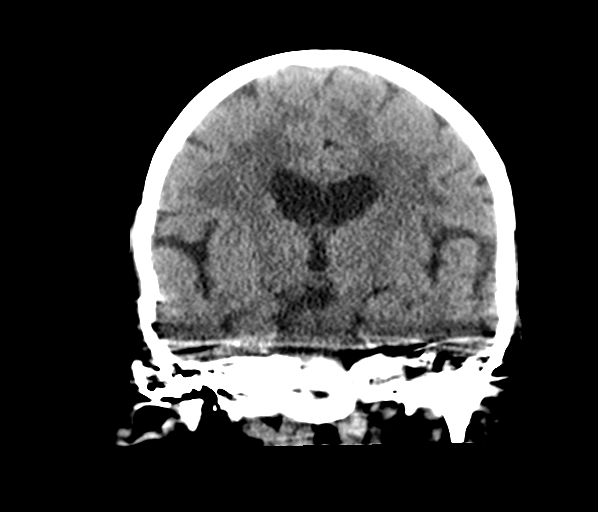
[im 49/66  brain]
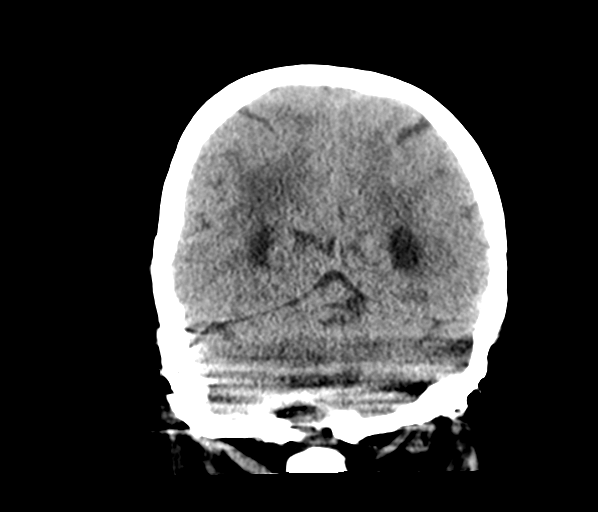

[Series 10: sagittal soft tissue · sagittal · 0.27mm/px · 2 of 48 slices shown]
[im 16/48  brain]
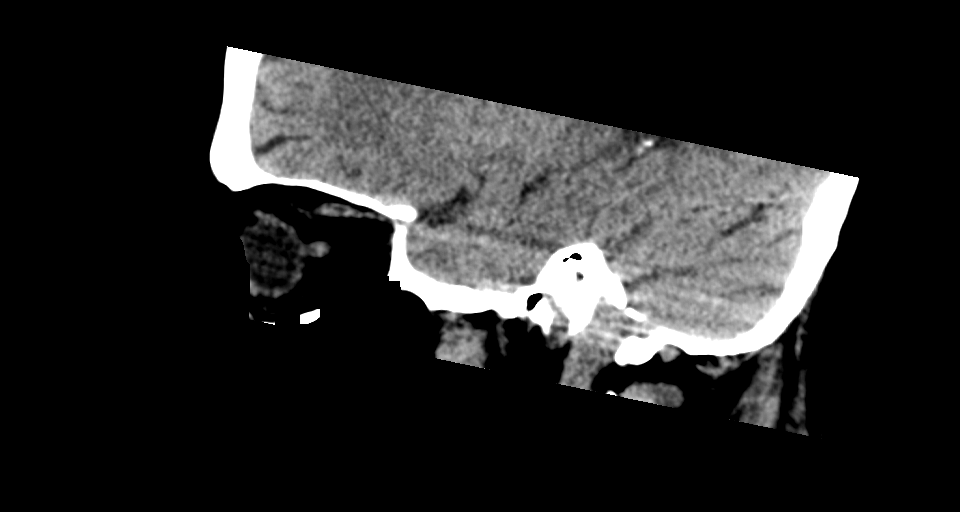
[im 32/48  brain]
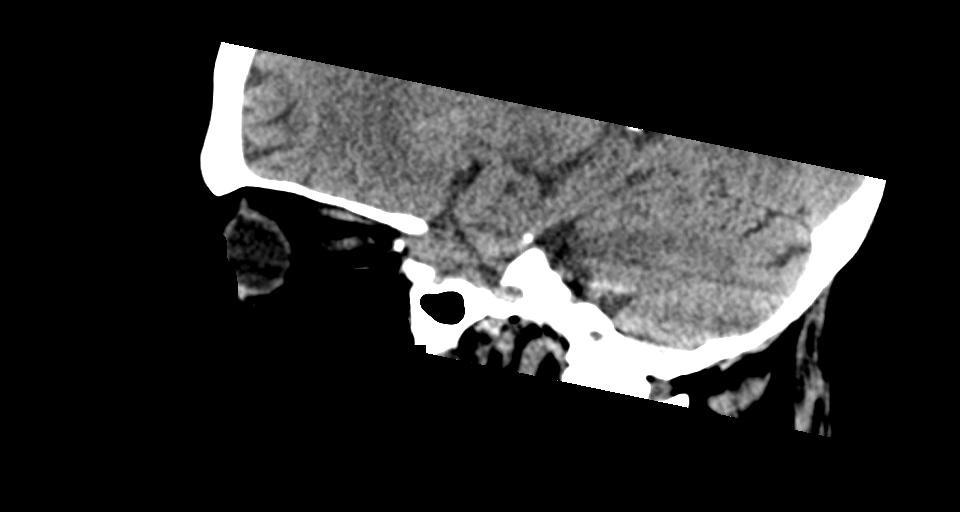

[14 of 47 positions shown; findings below may reference images not displayed]

FINDINGS: Brain:

Motion degraded examination.

Mild generalized parenchymal atrophy.

Moderate patchy hypoattenuation within the cerebral white matter is
nonspecific, but consistent with chronic small vessel ischemic
disease.

There is no acute intracranial hemorrhage.

No demarcated cortical infarct.

No extra-axial fluid collection.

No evidence of intracranial mass.

No midline shift.

Vascular: No hyperdense vessel.

Skull: Normal. Negative for fracture or focal lesion.

Sinuses/Orbits: Visualized orbits show no acute finding. No
significant paranasal sinus disease or mastoid effusion at the
imaged levels.
IMPRESSION: Motion degraded examination.

No evidence of acute intracranial abnormality.

Mild generalized parenchymal atrophy with moderate chronic small
vessel ischemic disease.

## 2021-12-18 DIAGNOSIS — H2000 Unspecified acute and subacute iridocyclitis: Secondary | ICD-10-CM | POA: Diagnosis not present

## 2021-12-18 DIAGNOSIS — M353 Polymyalgia rheumatica: Secondary | ICD-10-CM | POA: Diagnosis not present

## 2021-12-18 DIAGNOSIS — M150 Primary generalized (osteo)arthritis: Secondary | ICD-10-CM | POA: Diagnosis not present

## 2021-12-18 DIAGNOSIS — Z7952 Long term (current) use of systemic steroids: Secondary | ICD-10-CM | POA: Diagnosis not present

## 2021-12-18 DIAGNOSIS — M2550 Pain in unspecified joint: Secondary | ICD-10-CM | POA: Diagnosis not present

## 2022-01-15 DIAGNOSIS — M79672 Pain in left foot: Secondary | ICD-10-CM | POA: Diagnosis not present

## 2022-01-23 DIAGNOSIS — Z961 Presence of intraocular lens: Secondary | ICD-10-CM | POA: Diagnosis not present

## 2022-01-23 DIAGNOSIS — H43813 Vitreous degeneration, bilateral: Secondary | ICD-10-CM | POA: Diagnosis not present

## 2022-03-20 DIAGNOSIS — M7062 Trochanteric bursitis, left hip: Secondary | ICD-10-CM | POA: Diagnosis not present

## 2022-03-20 DIAGNOSIS — M25552 Pain in left hip: Secondary | ICD-10-CM | POA: Diagnosis not present

## 2022-05-07 DIAGNOSIS — M25562 Pain in left knee: Secondary | ICD-10-CM | POA: Diagnosis not present

## 2022-05-07 DIAGNOSIS — M1712 Unilateral primary osteoarthritis, left knee: Secondary | ICD-10-CM | POA: Diagnosis not present

## 2022-05-12 DIAGNOSIS — N183 Chronic kidney disease, stage 3 unspecified: Secondary | ICD-10-CM | POA: Diagnosis not present

## 2022-05-12 DIAGNOSIS — I10 Essential (primary) hypertension: Secondary | ICD-10-CM | POA: Diagnosis not present

## 2022-05-12 DIAGNOSIS — M353 Polymyalgia rheumatica: Secondary | ICD-10-CM | POA: Diagnosis not present

## 2022-05-12 DIAGNOSIS — M1711 Unilateral primary osteoarthritis, right knee: Secondary | ICD-10-CM | POA: Diagnosis not present

## 2022-05-12 DIAGNOSIS — E7800 Pure hypercholesterolemia, unspecified: Secondary | ICD-10-CM | POA: Diagnosis not present

## 2022-05-26 DIAGNOSIS — M7062 Trochanteric bursitis, left hip: Secondary | ICD-10-CM | POA: Diagnosis not present

## 2022-05-26 DIAGNOSIS — M1712 Unilateral primary osteoarthritis, left knee: Secondary | ICD-10-CM | POA: Diagnosis not present

## 2022-06-05 DIAGNOSIS — M25552 Pain in left hip: Secondary | ICD-10-CM | POA: Diagnosis not present

## 2022-06-05 DIAGNOSIS — M7062 Trochanteric bursitis, left hip: Secondary | ICD-10-CM | POA: Diagnosis not present

## 2022-06-05 DIAGNOSIS — M25562 Pain in left knee: Secondary | ICD-10-CM | POA: Diagnosis not present

## 2022-06-11 DIAGNOSIS — M7062 Trochanteric bursitis, left hip: Secondary | ICD-10-CM | POA: Diagnosis not present

## 2022-06-11 DIAGNOSIS — M25552 Pain in left hip: Secondary | ICD-10-CM | POA: Diagnosis not present

## 2022-06-11 DIAGNOSIS — M25562 Pain in left knee: Secondary | ICD-10-CM | POA: Diagnosis not present

## 2022-06-13 DIAGNOSIS — M25552 Pain in left hip: Secondary | ICD-10-CM | POA: Diagnosis not present

## 2022-06-13 DIAGNOSIS — M25562 Pain in left knee: Secondary | ICD-10-CM | POA: Diagnosis not present

## 2022-06-13 DIAGNOSIS — M7062 Trochanteric bursitis, left hip: Secondary | ICD-10-CM | POA: Diagnosis not present

## 2022-06-17 DIAGNOSIS — H2000 Unspecified acute and subacute iridocyclitis: Secondary | ICD-10-CM | POA: Diagnosis not present

## 2022-06-17 DIAGNOSIS — M1991 Primary osteoarthritis, unspecified site: Secondary | ICD-10-CM | POA: Diagnosis not present

## 2022-06-17 DIAGNOSIS — Z7952 Long term (current) use of systemic steroids: Secondary | ICD-10-CM | POA: Diagnosis not present

## 2022-06-17 DIAGNOSIS — M353 Polymyalgia rheumatica: Secondary | ICD-10-CM | POA: Diagnosis not present

## 2022-06-18 DIAGNOSIS — M7062 Trochanteric bursitis, left hip: Secondary | ICD-10-CM | POA: Diagnosis not present

## 2022-06-18 DIAGNOSIS — M25562 Pain in left knee: Secondary | ICD-10-CM | POA: Diagnosis not present

## 2022-06-18 DIAGNOSIS — M25552 Pain in left hip: Secondary | ICD-10-CM | POA: Diagnosis not present

## 2022-06-20 DIAGNOSIS — M7062 Trochanteric bursitis, left hip: Secondary | ICD-10-CM | POA: Diagnosis not present

## 2022-06-20 DIAGNOSIS — M25552 Pain in left hip: Secondary | ICD-10-CM | POA: Diagnosis not present

## 2022-06-20 DIAGNOSIS — M25562 Pain in left knee: Secondary | ICD-10-CM | POA: Diagnosis not present

## 2022-06-25 DIAGNOSIS — M25562 Pain in left knee: Secondary | ICD-10-CM | POA: Diagnosis not present

## 2022-06-25 DIAGNOSIS — M7062 Trochanteric bursitis, left hip: Secondary | ICD-10-CM | POA: Diagnosis not present

## 2022-06-25 DIAGNOSIS — M25552 Pain in left hip: Secondary | ICD-10-CM | POA: Diagnosis not present

## 2022-06-27 DIAGNOSIS — M7062 Trochanteric bursitis, left hip: Secondary | ICD-10-CM | POA: Diagnosis not present

## 2022-06-27 DIAGNOSIS — M25562 Pain in left knee: Secondary | ICD-10-CM | POA: Diagnosis not present

## 2022-06-27 DIAGNOSIS — M25552 Pain in left hip: Secondary | ICD-10-CM | POA: Diagnosis not present

## 2022-07-02 DIAGNOSIS — M25552 Pain in left hip: Secondary | ICD-10-CM | POA: Diagnosis not present

## 2022-07-02 DIAGNOSIS — M7062 Trochanteric bursitis, left hip: Secondary | ICD-10-CM | POA: Diagnosis not present

## 2022-07-02 DIAGNOSIS — M25562 Pain in left knee: Secondary | ICD-10-CM | POA: Diagnosis not present

## 2022-07-04 DIAGNOSIS — M25552 Pain in left hip: Secondary | ICD-10-CM | POA: Diagnosis not present

## 2022-07-04 DIAGNOSIS — M25562 Pain in left knee: Secondary | ICD-10-CM | POA: Diagnosis not present

## 2022-07-04 DIAGNOSIS — M7062 Trochanteric bursitis, left hip: Secondary | ICD-10-CM | POA: Diagnosis not present

## 2022-07-11 DIAGNOSIS — M25562 Pain in left knee: Secondary | ICD-10-CM | POA: Diagnosis not present

## 2022-07-11 DIAGNOSIS — M25552 Pain in left hip: Secondary | ICD-10-CM | POA: Diagnosis not present

## 2022-07-11 DIAGNOSIS — M7062 Trochanteric bursitis, left hip: Secondary | ICD-10-CM | POA: Diagnosis not present

## 2022-08-06 DIAGNOSIS — M7062 Trochanteric bursitis, left hip: Secondary | ICD-10-CM | POA: Diagnosis not present

## 2022-08-06 DIAGNOSIS — M25562 Pain in left knee: Secondary | ICD-10-CM | POA: Diagnosis not present

## 2022-08-06 DIAGNOSIS — M25552 Pain in left hip: Secondary | ICD-10-CM | POA: Diagnosis not present

## 2022-08-13 DIAGNOSIS — M25552 Pain in left hip: Secondary | ICD-10-CM | POA: Diagnosis not present

## 2022-08-13 DIAGNOSIS — M25562 Pain in left knee: Secondary | ICD-10-CM | POA: Diagnosis not present

## 2022-08-13 DIAGNOSIS — M7062 Trochanteric bursitis, left hip: Secondary | ICD-10-CM | POA: Diagnosis not present

## 2022-08-22 DIAGNOSIS — M25552 Pain in left hip: Secondary | ICD-10-CM | POA: Diagnosis not present

## 2022-08-22 DIAGNOSIS — M25562 Pain in left knee: Secondary | ICD-10-CM | POA: Diagnosis not present

## 2022-08-22 DIAGNOSIS — M7062 Trochanteric bursitis, left hip: Secondary | ICD-10-CM | POA: Diagnosis not present

## 2022-08-29 DIAGNOSIS — M25552 Pain in left hip: Secondary | ICD-10-CM | POA: Diagnosis not present

## 2022-08-29 DIAGNOSIS — M7062 Trochanteric bursitis, left hip: Secondary | ICD-10-CM | POA: Diagnosis not present

## 2022-08-29 DIAGNOSIS — M25562 Pain in left knee: Secondary | ICD-10-CM | POA: Diagnosis not present

## 2022-09-05 DIAGNOSIS — M7062 Trochanteric bursitis, left hip: Secondary | ICD-10-CM | POA: Diagnosis not present

## 2022-09-05 DIAGNOSIS — M25562 Pain in left knee: Secondary | ICD-10-CM | POA: Diagnosis not present

## 2022-09-05 DIAGNOSIS — M25552 Pain in left hip: Secondary | ICD-10-CM | POA: Diagnosis not present

## 2022-09-15 DIAGNOSIS — M25562 Pain in left knee: Secondary | ICD-10-CM | POA: Diagnosis not present

## 2022-09-15 DIAGNOSIS — M25552 Pain in left hip: Secondary | ICD-10-CM | POA: Diagnosis not present

## 2022-09-15 DIAGNOSIS — M7062 Trochanteric bursitis, left hip: Secondary | ICD-10-CM | POA: Diagnosis not present

## 2022-11-11 ENCOUNTER — Other Ambulatory Visit: Payer: Self-pay | Admitting: Obstetrics and Gynecology

## 2022-11-11 DIAGNOSIS — M8580 Other specified disorders of bone density and structure, unspecified site: Secondary | ICD-10-CM | POA: Diagnosis not present

## 2022-11-11 DIAGNOSIS — M25572 Pain in left ankle and joints of left foot: Secondary | ICD-10-CM | POA: Diagnosis not present

## 2022-11-11 DIAGNOSIS — M76822 Posterior tibial tendinitis, left leg: Secondary | ICD-10-CM | POA: Diagnosis not present

## 2022-11-14 ENCOUNTER — Other Ambulatory Visit: Payer: Medicare Other

## 2022-11-25 DIAGNOSIS — I10 Essential (primary) hypertension: Secondary | ICD-10-CM | POA: Diagnosis not present

## 2022-11-25 DIAGNOSIS — M353 Polymyalgia rheumatica: Secondary | ICD-10-CM | POA: Diagnosis not present

## 2022-11-25 DIAGNOSIS — N183 Chronic kidney disease, stage 3 unspecified: Secondary | ICD-10-CM | POA: Diagnosis not present

## 2022-11-25 DIAGNOSIS — B379 Candidiasis, unspecified: Secondary | ICD-10-CM | POA: Diagnosis not present

## 2022-11-25 DIAGNOSIS — Z0000 Encounter for general adult medical examination without abnormal findings: Secondary | ICD-10-CM | POA: Diagnosis not present

## 2022-11-25 DIAGNOSIS — R8281 Pyuria: Secondary | ICD-10-CM | POA: Diagnosis not present

## 2022-12-02 DIAGNOSIS — M25572 Pain in left ankle and joints of left foot: Secondary | ICD-10-CM | POA: Diagnosis not present

## 2022-12-24 DIAGNOSIS — Z7952 Long term (current) use of systemic steroids: Secondary | ICD-10-CM | POA: Diagnosis not present

## 2022-12-24 DIAGNOSIS — H2000 Unspecified acute and subacute iridocyclitis: Secondary | ICD-10-CM | POA: Diagnosis not present

## 2022-12-24 DIAGNOSIS — M353 Polymyalgia rheumatica: Secondary | ICD-10-CM | POA: Diagnosis not present

## 2022-12-24 DIAGNOSIS — M1991 Primary osteoarthritis, unspecified site: Secondary | ICD-10-CM | POA: Diagnosis not present

## 2023-01-14 DIAGNOSIS — S81002A Unspecified open wound, left knee, initial encounter: Secondary | ICD-10-CM | POA: Diagnosis not present

## 2023-01-14 DIAGNOSIS — R0781 Pleurodynia: Secondary | ICD-10-CM | POA: Diagnosis not present

## 2023-01-14 DIAGNOSIS — Z9181 History of falling: Secondary | ICD-10-CM | POA: Diagnosis not present

## 2023-01-15 ENCOUNTER — Ambulatory Visit
Admission: RE | Admit: 2023-01-15 | Discharge: 2023-01-15 | Disposition: A | Discharge status: Home / Self Care | Payer: Medicare Other | Source: Ambulatory Visit | Attending: Physician Assistant | Admitting: Physician Assistant

## 2023-01-15 ENCOUNTER — Other Ambulatory Visit: Payer: Self-pay | Admitting: Physician Assistant

## 2023-01-15 DIAGNOSIS — S2241XA Multiple fractures of ribs, right side, initial encounter for closed fracture: Secondary | ICD-10-CM | POA: Diagnosis not present

## 2023-01-15 DIAGNOSIS — R0781 Pleurodynia: Secondary | ICD-10-CM

## 2023-01-15 DIAGNOSIS — Z9181 History of falling: Secondary | ICD-10-CM

## 2023-01-29 DIAGNOSIS — Z961 Presence of intraocular lens: Secondary | ICD-10-CM | POA: Diagnosis not present

## 2023-01-29 DIAGNOSIS — H43813 Vitreous degeneration, bilateral: Secondary | ICD-10-CM | POA: Diagnosis not present

## 2023-02-10 DIAGNOSIS — R300 Dysuria: Secondary | ICD-10-CM | POA: Diagnosis not present

## 2023-05-11 ENCOUNTER — Ambulatory Visit
Admission: RE | Admit: 2023-05-11 | Discharge: 2023-05-11 | Disposition: A | Discharge status: Home / Self Care | Payer: Medicare Other | Source: Ambulatory Visit | Attending: Obstetrics and Gynecology | Admitting: Obstetrics and Gynecology

## 2023-05-11 DIAGNOSIS — M8588 Other specified disorders of bone density and structure, other site: Secondary | ICD-10-CM | POA: Diagnosis not present

## 2023-05-11 DIAGNOSIS — M8580 Other specified disorders of bone density and structure, unspecified site: Secondary | ICD-10-CM

## 2023-05-11 DIAGNOSIS — E349 Endocrine disorder, unspecified: Secondary | ICD-10-CM | POA: Diagnosis not present

## 2023-06-02 DIAGNOSIS — N183 Chronic kidney disease, stage 3 unspecified: Secondary | ICD-10-CM | POA: Diagnosis not present

## 2023-06-02 DIAGNOSIS — M353 Polymyalgia rheumatica: Secondary | ICD-10-CM | POA: Diagnosis not present

## 2023-06-02 DIAGNOSIS — R300 Dysuria: Secondary | ICD-10-CM | POA: Diagnosis not present

## 2023-06-02 DIAGNOSIS — I10 Essential (primary) hypertension: Secondary | ICD-10-CM | POA: Diagnosis not present

## 2023-06-24 DIAGNOSIS — H2000 Unspecified acute and subacute iridocyclitis: Secondary | ICD-10-CM | POA: Diagnosis not present

## 2023-06-24 DIAGNOSIS — M353 Polymyalgia rheumatica: Secondary | ICD-10-CM | POA: Diagnosis not present

## 2023-06-24 DIAGNOSIS — M1991 Primary osteoarthritis, unspecified site: Secondary | ICD-10-CM | POA: Diagnosis not present

## 2023-06-24 DIAGNOSIS — Z7952 Long term (current) use of systemic steroids: Secondary | ICD-10-CM | POA: Diagnosis not present

## 2023-08-06 DIAGNOSIS — N3281 Overactive bladder: Secondary | ICD-10-CM | POA: Diagnosis not present

## 2023-09-17 DIAGNOSIS — N3281 Overactive bladder: Secondary | ICD-10-CM | POA: Diagnosis not present

## 2023-10-15 DIAGNOSIS — N3281 Overactive bladder: Secondary | ICD-10-CM | POA: Diagnosis not present

## 2023-12-15 DIAGNOSIS — M7062 Trochanteric bursitis, left hip: Secondary | ICD-10-CM | POA: Diagnosis not present

## 2023-12-15 DIAGNOSIS — I10 Essential (primary) hypertension: Secondary | ICD-10-CM | POA: Diagnosis not present

## 2023-12-15 DIAGNOSIS — M353 Polymyalgia rheumatica: Secondary | ICD-10-CM | POA: Diagnosis not present

## 2023-12-15 DIAGNOSIS — N183 Chronic kidney disease, stage 3 unspecified: Secondary | ICD-10-CM | POA: Diagnosis not present

## 2024-01-04 DIAGNOSIS — H2000 Unspecified acute and subacute iridocyclitis: Secondary | ICD-10-CM | POA: Diagnosis not present

## 2024-01-04 DIAGNOSIS — Z7952 Long term (current) use of systemic steroids: Secondary | ICD-10-CM | POA: Diagnosis not present

## 2024-01-04 DIAGNOSIS — M1991 Primary osteoarthritis, unspecified site: Secondary | ICD-10-CM | POA: Diagnosis not present

## 2024-01-04 DIAGNOSIS — M353 Polymyalgia rheumatica: Secondary | ICD-10-CM | POA: Diagnosis not present

## 2024-01-07 DIAGNOSIS — M7062 Trochanteric bursitis, left hip: Secondary | ICD-10-CM | POA: Diagnosis not present

## 2024-01-12 DIAGNOSIS — M25552 Pain in left hip: Secondary | ICD-10-CM | POA: Diagnosis not present

## 2024-01-12 DIAGNOSIS — M6281 Muscle weakness (generalized): Secondary | ICD-10-CM | POA: Diagnosis not present

## 2024-01-15 DIAGNOSIS — M6281 Muscle weakness (generalized): Secondary | ICD-10-CM | POA: Diagnosis not present

## 2024-01-15 DIAGNOSIS — M25552 Pain in left hip: Secondary | ICD-10-CM | POA: Diagnosis not present

## 2024-01-18 DIAGNOSIS — M6281 Muscle weakness (generalized): Secondary | ICD-10-CM | POA: Diagnosis not present

## 2024-01-18 DIAGNOSIS — M25552 Pain in left hip: Secondary | ICD-10-CM | POA: Diagnosis not present

## 2024-01-21 DIAGNOSIS — M25552 Pain in left hip: Secondary | ICD-10-CM | POA: Diagnosis not present

## 2024-01-21 DIAGNOSIS — M6281 Muscle weakness (generalized): Secondary | ICD-10-CM | POA: Diagnosis not present

## 2024-01-25 DIAGNOSIS — M6281 Muscle weakness (generalized): Secondary | ICD-10-CM | POA: Diagnosis not present

## 2024-01-25 DIAGNOSIS — M25552 Pain in left hip: Secondary | ICD-10-CM | POA: Diagnosis not present

## 2024-01-29 DIAGNOSIS — M6281 Muscle weakness (generalized): Secondary | ICD-10-CM | POA: Diagnosis not present

## 2024-01-29 DIAGNOSIS — M25552 Pain in left hip: Secondary | ICD-10-CM | POA: Diagnosis not present

## 2024-02-01 DIAGNOSIS — M25552 Pain in left hip: Secondary | ICD-10-CM | POA: Diagnosis not present

## 2024-02-01 DIAGNOSIS — M6281 Muscle weakness (generalized): Secondary | ICD-10-CM | POA: Diagnosis not present

## 2024-02-04 DIAGNOSIS — M25552 Pain in left hip: Secondary | ICD-10-CM | POA: Diagnosis not present

## 2024-02-04 DIAGNOSIS — M6281 Muscle weakness (generalized): Secondary | ICD-10-CM | POA: Diagnosis not present

## 2024-02-09 DIAGNOSIS — M25552 Pain in left hip: Secondary | ICD-10-CM | POA: Diagnosis not present

## 2024-02-09 DIAGNOSIS — M6281 Muscle weakness (generalized): Secondary | ICD-10-CM | POA: Diagnosis not present

## 2024-02-12 DIAGNOSIS — M25552 Pain in left hip: Secondary | ICD-10-CM | POA: Diagnosis not present

## 2024-02-12 DIAGNOSIS — M6281 Muscle weakness (generalized): Secondary | ICD-10-CM | POA: Diagnosis not present

## 2024-02-17 DIAGNOSIS — M6281 Muscle weakness (generalized): Secondary | ICD-10-CM | POA: Diagnosis not present

## 2024-02-17 DIAGNOSIS — M25552 Pain in left hip: Secondary | ICD-10-CM | POA: Diagnosis not present

## 2024-02-22 DIAGNOSIS — M25552 Pain in left hip: Secondary | ICD-10-CM | POA: Diagnosis not present

## 2024-02-22 DIAGNOSIS — M6281 Muscle weakness (generalized): Secondary | ICD-10-CM | POA: Diagnosis not present

## 2024-02-25 DIAGNOSIS — M6281 Muscle weakness (generalized): Secondary | ICD-10-CM | POA: Diagnosis not present

## 2024-02-25 DIAGNOSIS — M25552 Pain in left hip: Secondary | ICD-10-CM | POA: Diagnosis not present

## 2024-02-29 DIAGNOSIS — M6281 Muscle weakness (generalized): Secondary | ICD-10-CM | POA: Diagnosis not present

## 2024-02-29 DIAGNOSIS — M25552 Pain in left hip: Secondary | ICD-10-CM | POA: Diagnosis not present

## 2024-03-04 DIAGNOSIS — M6281 Muscle weakness (generalized): Secondary | ICD-10-CM | POA: Diagnosis not present

## 2024-03-04 DIAGNOSIS — M25552 Pain in left hip: Secondary | ICD-10-CM | POA: Diagnosis not present

## 2024-03-07 DIAGNOSIS — M6281 Muscle weakness (generalized): Secondary | ICD-10-CM | POA: Diagnosis not present

## 2024-03-07 DIAGNOSIS — M25552 Pain in left hip: Secondary | ICD-10-CM | POA: Diagnosis not present

## 2024-03-08 DIAGNOSIS — M25552 Pain in left hip: Secondary | ICD-10-CM | POA: Diagnosis not present

## 2024-03-08 DIAGNOSIS — M6281 Muscle weakness (generalized): Secondary | ICD-10-CM | POA: Diagnosis not present

## 2024-03-16 DIAGNOSIS — M25552 Pain in left hip: Secondary | ICD-10-CM | POA: Diagnosis not present

## 2024-03-16 DIAGNOSIS — M6281 Muscle weakness (generalized): Secondary | ICD-10-CM | POA: Diagnosis not present

## 2024-03-18 DIAGNOSIS — M25552 Pain in left hip: Secondary | ICD-10-CM | POA: Diagnosis not present

## 2024-03-18 DIAGNOSIS — M6281 Muscle weakness (generalized): Secondary | ICD-10-CM | POA: Diagnosis not present

## 2024-04-28 DIAGNOSIS — H43813 Vitreous degeneration, bilateral: Secondary | ICD-10-CM | POA: Diagnosis not present

## 2024-04-28 DIAGNOSIS — Z961 Presence of intraocular lens: Secondary | ICD-10-CM | POA: Diagnosis not present

## 2024-04-28 DIAGNOSIS — H52223 Regular astigmatism, bilateral: Secondary | ICD-10-CM | POA: Diagnosis not present

## 2024-04-28 DIAGNOSIS — H5213 Myopia, bilateral: Secondary | ICD-10-CM | POA: Diagnosis not present

## 2024-04-28 DIAGNOSIS — H524 Presbyopia: Secondary | ICD-10-CM | POA: Diagnosis not present

## 2024-05-31 DIAGNOSIS — S39012A Strain of muscle, fascia and tendon of lower back, initial encounter: Secondary | ICD-10-CM | POA: Diagnosis not present

## 2024-05-31 DIAGNOSIS — S338XXA Sprain of other parts of lumbar spine and pelvis, initial encounter: Secondary | ICD-10-CM | POA: Diagnosis not present

## 2024-05-31 DIAGNOSIS — M9905 Segmental and somatic dysfunction of pelvic region: Secondary | ICD-10-CM | POA: Diagnosis not present

## 2024-05-31 DIAGNOSIS — M9902 Segmental and somatic dysfunction of thoracic region: Secondary | ICD-10-CM | POA: Diagnosis not present

## 2024-06-02 DIAGNOSIS — M9902 Segmental and somatic dysfunction of thoracic region: Secondary | ICD-10-CM | POA: Diagnosis not present

## 2024-06-02 DIAGNOSIS — M9905 Segmental and somatic dysfunction of pelvic region: Secondary | ICD-10-CM | POA: Diagnosis not present

## 2024-06-02 DIAGNOSIS — S39012A Strain of muscle, fascia and tendon of lower back, initial encounter: Secondary | ICD-10-CM | POA: Diagnosis not present

## 2024-06-02 DIAGNOSIS — S338XXA Sprain of other parts of lumbar spine and pelvis, initial encounter: Secondary | ICD-10-CM | POA: Diagnosis not present

## 2024-06-08 DIAGNOSIS — S39012A Strain of muscle, fascia and tendon of lower back, initial encounter: Secondary | ICD-10-CM | POA: Diagnosis not present

## 2024-06-08 DIAGNOSIS — M9902 Segmental and somatic dysfunction of thoracic region: Secondary | ICD-10-CM | POA: Diagnosis not present

## 2024-06-08 DIAGNOSIS — M9905 Segmental and somatic dysfunction of pelvic region: Secondary | ICD-10-CM | POA: Diagnosis not present

## 2024-06-08 DIAGNOSIS — S338XXA Sprain of other parts of lumbar spine and pelvis, initial encounter: Secondary | ICD-10-CM | POA: Diagnosis not present

## 2024-06-14 DIAGNOSIS — M9905 Segmental and somatic dysfunction of pelvic region: Secondary | ICD-10-CM | POA: Diagnosis not present

## 2024-06-14 DIAGNOSIS — S338XXA Sprain of other parts of lumbar spine and pelvis, initial encounter: Secondary | ICD-10-CM | POA: Diagnosis not present

## 2024-06-14 DIAGNOSIS — M9902 Segmental and somatic dysfunction of thoracic region: Secondary | ICD-10-CM | POA: Diagnosis not present

## 2024-06-14 DIAGNOSIS — S39012A Strain of muscle, fascia and tendon of lower back, initial encounter: Secondary | ICD-10-CM | POA: Diagnosis not present

## 2024-06-16 DIAGNOSIS — M353 Polymyalgia rheumatica: Secondary | ICD-10-CM | POA: Diagnosis not present

## 2024-06-16 DIAGNOSIS — B379 Candidiasis, unspecified: Secondary | ICD-10-CM | POA: Diagnosis not present

## 2024-06-16 DIAGNOSIS — R8281 Pyuria: Secondary | ICD-10-CM | POA: Diagnosis not present

## 2024-06-16 DIAGNOSIS — N3281 Overactive bladder: Secondary | ICD-10-CM | POA: Diagnosis not present

## 2024-06-16 DIAGNOSIS — I10 Essential (primary) hypertension: Secondary | ICD-10-CM | POA: Diagnosis not present

## 2024-06-16 DIAGNOSIS — S338XXA Sprain of other parts of lumbar spine and pelvis, initial encounter: Secondary | ICD-10-CM | POA: Diagnosis not present

## 2024-06-16 DIAGNOSIS — Z0000 Encounter for general adult medical examination without abnormal findings: Secondary | ICD-10-CM | POA: Diagnosis not present

## 2024-06-16 DIAGNOSIS — M9902 Segmental and somatic dysfunction of thoracic region: Secondary | ICD-10-CM | POA: Diagnosis not present

## 2024-06-16 DIAGNOSIS — M9905 Segmental and somatic dysfunction of pelvic region: Secondary | ICD-10-CM | POA: Diagnosis not present

## 2024-06-16 DIAGNOSIS — M7062 Trochanteric bursitis, left hip: Secondary | ICD-10-CM | POA: Diagnosis not present

## 2024-06-16 DIAGNOSIS — S39012A Strain of muscle, fascia and tendon of lower back, initial encounter: Secondary | ICD-10-CM | POA: Diagnosis not present

## 2024-06-16 DIAGNOSIS — N183 Chronic kidney disease, stage 3 unspecified: Secondary | ICD-10-CM | POA: Diagnosis not present

## 2024-06-21 DIAGNOSIS — M9905 Segmental and somatic dysfunction of pelvic region: Secondary | ICD-10-CM | POA: Diagnosis not present

## 2024-06-21 DIAGNOSIS — S338XXA Sprain of other parts of lumbar spine and pelvis, initial encounter: Secondary | ICD-10-CM | POA: Diagnosis not present

## 2024-06-21 DIAGNOSIS — M9902 Segmental and somatic dysfunction of thoracic region: Secondary | ICD-10-CM | POA: Diagnosis not present

## 2024-06-21 DIAGNOSIS — S39012A Strain of muscle, fascia and tendon of lower back, initial encounter: Secondary | ICD-10-CM | POA: Diagnosis not present

## 2024-06-23 DIAGNOSIS — S338XXA Sprain of other parts of lumbar spine and pelvis, initial encounter: Secondary | ICD-10-CM | POA: Diagnosis not present

## 2024-06-23 DIAGNOSIS — M9905 Segmental and somatic dysfunction of pelvic region: Secondary | ICD-10-CM | POA: Diagnosis not present

## 2024-06-23 DIAGNOSIS — M9902 Segmental and somatic dysfunction of thoracic region: Secondary | ICD-10-CM | POA: Diagnosis not present

## 2024-06-23 DIAGNOSIS — S39012A Strain of muscle, fascia and tendon of lower back, initial encounter: Secondary | ICD-10-CM | POA: Diagnosis not present

## 2024-06-28 DIAGNOSIS — S39012A Strain of muscle, fascia and tendon of lower back, initial encounter: Secondary | ICD-10-CM | POA: Diagnosis not present

## 2024-06-28 DIAGNOSIS — S338XXA Sprain of other parts of lumbar spine and pelvis, initial encounter: Secondary | ICD-10-CM | POA: Diagnosis not present

## 2024-06-28 DIAGNOSIS — M9902 Segmental and somatic dysfunction of thoracic region: Secondary | ICD-10-CM | POA: Diagnosis not present

## 2024-06-28 DIAGNOSIS — M9905 Segmental and somatic dysfunction of pelvic region: Secondary | ICD-10-CM | POA: Diagnosis not present

## 2024-07-05 DIAGNOSIS — Z7952 Long term (current) use of systemic steroids: Secondary | ICD-10-CM | POA: Diagnosis not present

## 2024-07-05 DIAGNOSIS — H2000 Unspecified acute and subacute iridocyclitis: Secondary | ICD-10-CM | POA: Diagnosis not present

## 2024-07-05 DIAGNOSIS — M1991 Primary osteoarthritis, unspecified site: Secondary | ICD-10-CM | POA: Diagnosis not present

## 2024-07-05 DIAGNOSIS — M353 Polymyalgia rheumatica: Secondary | ICD-10-CM | POA: Diagnosis not present

## 2024-07-08 DIAGNOSIS — M9902 Segmental and somatic dysfunction of thoracic region: Secondary | ICD-10-CM | POA: Diagnosis not present

## 2024-07-08 DIAGNOSIS — S338XXA Sprain of other parts of lumbar spine and pelvis, initial encounter: Secondary | ICD-10-CM | POA: Diagnosis not present

## 2024-07-08 DIAGNOSIS — M9905 Segmental and somatic dysfunction of pelvic region: Secondary | ICD-10-CM | POA: Diagnosis not present

## 2024-07-08 DIAGNOSIS — S39012A Strain of muscle, fascia and tendon of lower back, initial encounter: Secondary | ICD-10-CM | POA: Diagnosis not present

## 2024-08-25 DIAGNOSIS — N3281 Overactive bladder: Secondary | ICD-10-CM | POA: Diagnosis not present
